# Patient Record
Sex: Male | Born: 1945 | Race: White | Hispanic: No | Marital: Married | Smoking: Former smoker
Health system: Southern US, Community
[De-identification: ages and names within clinical notes are randomized; demographics above are authoritative.]

## PROBLEM LIST (undated history)

## (undated) DIAGNOSIS — I10 Essential (primary) hypertension: Secondary | ICD-10-CM

## (undated) DIAGNOSIS — R51 Headache: Secondary | ICD-10-CM

## (undated) DIAGNOSIS — R519 Headache, unspecified: Secondary | ICD-10-CM

## (undated) DIAGNOSIS — R7303 Prediabetes: Secondary | ICD-10-CM

## (undated) DIAGNOSIS — M1990 Unspecified osteoarthritis, unspecified site: Secondary | ICD-10-CM

## (undated) HISTORY — PX: SHOULDER SURGERY: SHX246

## (undated) HISTORY — PX: OTHER SURGICAL HISTORY: SHX169

## (undated) HISTORY — PX: APPENDECTOMY: SHX54

## (undated) HISTORY — PX: CERVICAL DISC SURGERY: SHX588

## (undated) HISTORY — PX: HAND SURGERY: SHX662

---

## 2002-01-20 ENCOUNTER — Ambulatory Visit (HOSPITAL_COMMUNITY): Admission: RE | Admit: 2002-01-20 | Discharge: 2002-01-20 | Payer: Self-pay | Admitting: Gastroenterology

## 2004-10-28 ENCOUNTER — Ambulatory Visit (HOSPITAL_BASED_OUTPATIENT_CLINIC_OR_DEPARTMENT_OTHER): Admission: RE | Admit: 2004-10-28 | Discharge: 2004-10-28 | Payer: Self-pay | Admitting: Orthopedic Surgery

## 2004-10-28 ENCOUNTER — Ambulatory Visit (HOSPITAL_COMMUNITY): Admission: RE | Admit: 2004-10-28 | Discharge: 2004-10-28 | Payer: Self-pay | Admitting: Orthopedic Surgery

## 2005-02-05 ENCOUNTER — Ambulatory Visit (HOSPITAL_BASED_OUTPATIENT_CLINIC_OR_DEPARTMENT_OTHER): Admission: RE | Admit: 2005-02-05 | Discharge: 2005-02-05 | Payer: Self-pay | Admitting: Orthopedic Surgery

## 2005-06-02 ENCOUNTER — Ambulatory Visit (HOSPITAL_COMMUNITY): Admission: RE | Admit: 2005-06-02 | Discharge: 2005-06-02 | Payer: Self-pay | Admitting: Orthopedic Surgery

## 2005-06-02 ENCOUNTER — Ambulatory Visit (HOSPITAL_BASED_OUTPATIENT_CLINIC_OR_DEPARTMENT_OTHER): Admission: RE | Admit: 2005-06-02 | Discharge: 2005-06-02 | Payer: Self-pay | Admitting: Orthopedic Surgery

## 2010-01-23 ENCOUNTER — Encounter: Admission: RE | Admit: 2010-01-23 | Discharge: 2010-01-23 | Payer: Self-pay | Admitting: Family Medicine

## 2011-04-24 NOTE — Op Note (Signed)
NAME:  Adam Bradshaw, Adam Bradshaw                 ACCOUNT NO.:  [id number]   MEDICAL RECORD NO.:  [id number]          PATIENT TYPE:  AMB   LOCATION:  DSC                          FACILITY:  [facility]   PHYSICIAN:  Katy Fitch. Sypher Montez Hageman., M.D.DATE OF BIRTH:  [birth year]   DATE OF PROCEDURE:  10/28/2004  DATE OF DISCHARGE:                                 OPERATIVE REPORT   PREOPERATIVE DIAGNOSIS:  Right thumb metacarpal phalangeal traumatic  arthritis.   POSTOPERATIVE DIAGNOSIS:  Right thumb metacarpal phalangeal traumatic  arthritis.   PROCEDURE:  Arthrodesis of right thumb metacarpal phalangeal joint.   SURGEON:  Katy Fitch. Sypher, M.D.   ASSISTANT:  Jonni Sanger, P.A.   ANESTHESIA:  General by LMA, supervising anesthesiologist is Bedelia Person, M.D.   INDICATIONS FOR PROCEDURE:  The patient is a 65 year old man who sustained  an injury to his right thumb metacarpal phalangeal joint in March of 2005.  He did not seek medical attention.  He is a patient familiar with [facility] as he underwent extensive reconstructive procedure in  1984.   He developed a progressive deformity with instability of his right thumb  metacarpal phalangeal joint due to a chronic ulnar collateral ligament  injury and progressive swelling and pain.   He sought a hand surgery consult and was noted to have advanced traumatic  arthritis of his right thumb metacarpal phalangeal joint with gross  instability.   As he is a gentleman who works vigorously with his hands, we recommended  proceeding directly to arthrodesis of his right thumb MP joint.  After  informed consent, he is brought to the operating room at this time.   DESCRIPTION OF PROCEDURE:  The patient is brought to the operating room and  placed in the supine position on the operating table.  Following the  induction of general anesthesia by LMA, the right arm was prepped with  Betadine soap and solution and sterilely draped.  1 gram of Ancef  is  administered as an IV prophylactic antibiotic.  Following exsanguination of  the limb with an esmarch bandage, an arterial tourniquet over the proximal  brachium is inflated to 230 mmHg.   The procedure commenced with a curvilinear incision exposing the extensor  mechanism overlying the metacarpal phalangeal joint.  The interval between  the extensor pollicis longus and the extensor pollicis brevis was incised  longitudinally revealing the capsule of the MP joint.  The capsule was  incised longitudinally followed by subperiosteal elevation of the capsule  off of the head of the metacarpal and proximal phalanx exposing the  collateral ligaments.  The radial and ulnar collateral ligament remnants  were resected at the level of the metacarpal neck followed by shotgun  opening of the metacarpal phalangeal joint.   A rongeur was used to tailor the metacarpal head to a congruous bullet shape  and the power reamer was used to reshape the proximal phalangeal base to a  cup to allow congruous placement of the MP joint at 15 degrees of flexion.   A 26 gauge wire was double stranded  and placed through a drill hole through  the base of the proximal phalanx followed by placement of two 0.045 inch  Kirschner wires, maintaining the MP joint at approximately 15 degrees of  flexion.   The Kirschner wires were advanced followed by tensioning of the tension band  wire dorsally.   AP lateral images of the construct revealed very satisfactory position of  the MP joint and internal fixation hardware.   The wounds were then irrigated thoroughly followed by repair of the capsule  with running suture of 4-0 Vicryl followed by repair of the extensor  mechanism with mattress sutures, knots buried, of 4-0 Mersilene and repair  of the skin with intradermal 3-0 Prolene.   A compressive dressing was applied with Xeroform sterile gauze and a thumb  spica splint.   There were no apparent complications.    The patient tolerated the surgery and anesthesia well.  He was transferred  to the recovery room with stable vital signs.   He will be discharged home with prescriptions for Percocet 7.5 mg one p.o.  q.6h p.r.n. pain, also Keflex 500 mg one p.o. q.8h x4 days for prophylactic  antibiotic, and Motrin 600 mg one p.o. q.6h p.r.n. pain 30 tablets with one  refill.      Robe   RVS/MEDQ  D:  10/28/2004  T:  10/28/2004  Job:  [id number]

## 2011-04-24 NOTE — Op Note (Signed)
NAME:  KAIN, MILOSEVIC                 ACCOUNT NO.:  [id number]   MEDICAL RECORD NO.:  [id number]          PATIENT TYPE:  AMB   LOCATION:  DSC                          FACILITY:  [facility]   PHYSICIAN:  Katy Fitch. Sypher Montez Hageman., M.D.DATE OF BIRTH:  11-03-1946   DATE OF PROCEDURE:  02/05/2005  DATE OF DISCHARGE:                                 OPERATIVE REPORT   PREOPERATIVE DIAGNOSIS:  Failed arthrodesis right thumb MP joint performed  in November 2005.   POSTOPERATIVE DIAGNOSIS:  Failed arthrodesis right thumb MP joint performed  in November 2005.   OPERATION:  1.  Removal of hardware, right thumb including tension band wire.  2.  Repeat arthrodesis of right thumb metacarpal phalangeal joint utilizing      120 mm standard Acutrak 2 compression screw and one mini Acutrak 2      compression screw.   OPERATING SURGEON:  Katy Fitch. Sypher, M.D.   ASSISTANT:  Jonni Sanger, P.A.   ANESTHESIA:  General by LMA.   SUPERVISING ANESTHESIOLOGIST:  Dr. Katrinka Blazing.   INDICATION:  Adam Bradshaw is a 65 year old gentleman who was referred for  evaluation and management of painful right thumb MP joint. 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  We recommended proceeding with elective arthrodesis in November 2005.  This  was accomplished under general anesthesia with placement of Kirschner wires  and a tension band.  He initially did well for four weeks postop, but due to  aggressive use of his hand while in his dressing, developed pin tract  infections.  Due to his significant infection unresponsive to doxycycline  orally, we elected to remove his pins at 5 weeks postop.   Mr. Nine was placed in a well molded thumb spica cast. He did not inform us  of his difficulties with claustrophobia.  However, upon returning home, he  became extremely anxious, paced in his yard for two hours and subsequently  cut his own cast off.  He returned two weeks later with an  unstable MP joint  and that was placed in a splint to no avail.  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  Therefore we  recommended an attempt at percutaneous fixation at this time.   Upon examination under anesthesia we identified that this was grossly  unstable and C-arm images suggested that he had significant bony resorption  suggestive of possible fibrous union verses low grade infection, therefore  we proceeded directly to open hardware removal and repeat fusion of the MP  joint.   PROCEDURE:  Biagio Snelson was brought to operating room and placed supine  position on the table.  Following induction of general anesthesia by LMA,  the right arm was prepped with Betadine soap solution, sterilely draped.  A  pneumatic tourniquet was applied proximal brachium.  Following  exsanguination limb Esmarch bandage, arterial tourniquet was inflated to 250  mmHg.  Preoperatively he was given 1 gram of Ancef as IV prophylactic  antibiotic.   Procedure commenced  with examination of the thumb MP joint under anesthesia.  There was grossly false motion with a more than 35 degree arc of flexion  extension and 15 degree arc of radial and ulnar deviation.   There was a 3-4 mm gap across the fusion site, suggesting a unstable fibrous  union.  Given this predicament we abandoned our attempts to do a  percutaneous fixation. The prior surgical incision was reopened, the tendon  split between extensor pollicis brevis and extensor pollicis longus taken  down sharply and the capsule elevated exposing the distal metacarpal and  proximal phalanx.   There was considerable erosion around the previous pin tracts but the  granulation tissue that did not appear to be acutely infected.  All  granulation tissue was removed followed by completion of repeat cup and cone  type arthrodesis. A power bur was used to decorticate the proximal  phalangeal base.  Bone was  removed from the palmar surface of the metacarpal  head and an effort to obtain local bone graft.  The MP joint was then placed  a few degrees of flexion and slight pronation to facilitate thumb pulp  contact with finger pulps during pinch prevention.   Two 0.045 inches Kirschner wires were placed securing the fusion site in a  satisfactory position followed by placement of a 20 mm standard percutaneous  Acutrak 2 and a 20 mm mini Acutrak screw across the fusion site creating  very satisfactory compression.   The wound was then irrigated.  The local bone graft placed in proper  position followed by repair of the capsule with running suture of 3-0  Ethibond and repair of the extensor interval and aponeurosis with figure-of-  eight sutures of 3-0 Ethibond with knots buried.   The wound was lavaged a second time followed by repair of the skin with  intradermal 3-0 Prolene.   A compressive thumb spica dressing was applied. There no apparent  complications. Note, for aftercare, he is given prescriptions for Dilaudid 2  milligrams p.o. q.4-6 h. p.r.n. pain, 30 tablets without refill. Also Keflex  5 milligrams one p.o. q.8 h x4 days as prophylactic antibiotic.      RVS/MEDQ  D:  02/05/2005  T:  02/05/2005  Job:  [id number]

## 2011-04-24 NOTE — Procedures (Signed)
[place]  Patient:    Adam Bradshaw, Adam Bradshaw Visit Number: [id number] MRN: [id number]          Service Type: END Location: ENDO Attending Physician:  Rich Brave Dictated by:   Florencia Reasons, M.D. Proc. Date: 01/20/02 Admit Date:  01/20/2002   CC:         Maricela Bo, M.D.   Procedure Report  PROCEDURE:  Colonoscopy.  INDICATION:  A 65 year old, for colon cancer screening.  FINDINGS:  Normal exam.  DESCRIPTION OF PROCEDURE:  The nature, purpose, and risks of the procedure had been discussed with the patient, who provided written consent.  Sedation was fentanyl 40 mcg and Versed 4 mg IV without arrhythmias or desaturation. Digital rectal exam was unremarkable.  The Olympus adjustable-tension pediatric video colonoscope was easily advanced to the cecum, as identified by the typical cecal appearance, and pullback was then performed.  The quality of the prep was excellent, and it was felt that all areas were well-seen.  This was a normal examination.  No polyps, cancer, colitis, vascular malformations, or significant diverticulosis were noted.  No biopsies were obtained.  The patient tolerated the procedure well, and there were no apparent complications.  IMPRESSION:  Normal screening colonoscopy in an asymptomatic 65 year old.  PLAN:  Consider flexible sigmoidoscopy or repeat colonoscopy in about five years. Dictated by:   Florencia Reasons, M.D. Attending Physician:  Rich Brave DD:  01/20/02 TD:  01/21/02 Job: [id number]

## 2011-04-24 NOTE — Op Note (Signed)
NAME:  Adam Bradshaw, Adam Bradshaw                 ACCOUNT NO.:  [id number]   MEDICAL RECORD NO.:  [id number]          PATIENT TYPE:  AMB   LOCATION:  DSC                          FACILITY:  [facility]   PHYSICIAN:  Katy Fitch. Sypher, M.D. 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   OPERATION:  1.  Diagnostic arthroscopy right glenohumeral joint.  2.  Arthroscopic debridement of anterior labrum and deep surface rotator      cuff degenerative tear of posterior supraspinatus and anterior      infraspinatus.  3.  Subacromial decompression with bursectomy, coracoacromial ligament      release and acromioplasty.  4.  Open resection of distal clavicle.   After extensive evaluation of the cuff. We elected not to proceed with  rotator cuff repair.   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   I have had a longstanding relationship with [name], treating number of  hand injuries over the years.   Recently he developed pain in his right shoulder and requested a  consultation on April 01, 2005. Films at that time documented severe AC  arthropathy and an MRI of the shoulder obtained and [facility] on April 01, 2005, revealed evidence of extensive tendinopathy of the infraspinatus,  supraspinatus tendons and a severely hypertrophic AC joint.   We recommended proceeding  with diagnostic arthroscopy at this time with  appropriate intervention, anticipating possible arthroscopic or open repair  of the rotator cuff.   After informed consent, [name] is brought to the operating room at this  time.   PROCEDURE:  [name]. Freid was brought to the operating room and placed in  supine position upon the operating table. Following anesthesia consultation  by Dr. Gelene Mink, an interscalene block was placed out complication.  Satisfactory anesthesia of the right upper extremity and forequarter was  accomplished.   Following induction of general orotracheal anesthesia, he was carefully  positioned beach-chair position with the aide of a of a torso and head  holder designed for shoulder arthroscopy.   The entire right arm and forequarter were prepped with DuraPrep and draped  with impervious arthroscopy drapes.   Examination of the shoulder under anesthesia revealed no gross instability.   The shoulder was then distended with 20 mL of sterile saline placed with a  spinal needle through an anterior approach. The scope was placed  atraumatically through a posterior portal. Diagnostic arthroscopy revealed  extensive deep surface tearing of the infraspinatus and supraspinatus  tendons. This was documented with the digital camera. An anterior portal  was  created under direct vision and a suction shaver was used to debride the  degenerative cuff as well as extensive degenerative changes of the anterior  labrum.   The anterior glenohumeral ligaments were intact. The subscapularis tendon  was intact. Approximately half the thickness of the supraspinatus and  infraspinatus remained intact. The teres minor was normal. The humeral head  and glenoid fossa were examined and the hyaline sigmoid cartilage was noted  be intact. The biceps anchor was stable and the biceps tendon was normal  through the rotator interval.   After thorough debridement of the deep surface  rotator cuff degenerative  changes, the scope was removed from the glenohumeral joint and placed in the  subacromial space. Extensive bursitis was resected, followed by use of  suction shaver to debride the capsule of the AC joint. The osteophyte at the  distal clavicle was documented with the digital camera followed by release  of coracoacromial ligament with a cutting cautery.   The acromion was leveled to a type I morphology with a beveled anterior  lateral edge and the distal clavicle was initially removed arthroscopically,  however, due to extensive bleeding and difficulty controlling Mr. Aversa  blood pressure we elected to complete the process with a small incision.   A 2 cm incision was fashioned exposing the distal 15 mm of clavicle. Baby  Bennett retractors were placed followed by resection of the clavicle with an  oscillating saw.   The dead space created by distal clavicle resection was repaired with three  mattress sutures of #2 FiberWire. The wound was repaired with subdermal  sutures of 2-0 Vicryl and intradermal 3-0 Prolene with Steri-Strips.   The scope was replaced in the subacromial space followed by thorough  examination of the acromioplasty and bipolar hemostasis.   We carefully palpated the rotator cuff and found that the deep surface tear  was not full-thickness in the supraspinatus, nor infraspinatus and given the  low profile of the tendon, I elected not to proceed with any repair at this  time.   We accomplished satisfactory decompression and distal clavicle resection.   The arthroscopic probe was removed followed by repair of the portals with  intradermal 3-0 Prolene.   There were no apparent complications.       RVS/MEDQ  D:  06/02/2005  T:  06/02/2005  Job:  [id number]

## 2013-04-05 ENCOUNTER — Ambulatory Visit: Payer: Self-pay | Admitting: Family Medicine

## 2013-12-13 ENCOUNTER — Ambulatory Visit: Payer: Self-pay | Admitting: Physician Assistant

## 2016-06-30 ENCOUNTER — Other Ambulatory Visit: Payer: Self-pay | Admitting: Neurosurgery

## 2016-06-30 DIAGNOSIS — M5416 Radiculopathy, lumbar region: Secondary | ICD-10-CM

## 2016-07-09 ENCOUNTER — Ambulatory Visit
Admission: RE | Admit: 2016-07-09 | Discharge: 2016-07-09 | Disposition: A | Discharge status: Home / Self Care | Payer: Medicare Other | Source: Ambulatory Visit | Attending: Neurosurgery | Admitting: Neurosurgery

## 2016-07-09 DIAGNOSIS — M5416 Radiculopathy, lumbar region: Secondary | ICD-10-CM

## 2016-07-13 ENCOUNTER — Other Ambulatory Visit: Payer: Self-pay | Admitting: Neurosurgery

## 2016-07-15 NOTE — Pre-Procedure Instructions (Signed)
Adam GardenerRoger D Bradshaw  07/15/2016     No Pharmacies Listed   Your procedure is scheduled on Tues, Aug 15 @ 1:20 PM  [facility] Admitting at 10:15 AM  Call this number if you have problems the morning of surgery:  [phone]   Remember:  Do not eat food or drink liquids after midnight.     Do not wear jewelry.  Do not wear lotions, powders, or colognes.    Men may shave face and neck.  Do not bring valuables to the hospital.  [facility] is not responsible for any belongings or valuables.  Contacts, dentures or bridgework may not be worn into surgery.  Leave your suitcase in the car.  After surgery it may be brought to your room.  For patients admitted to the hospital, discharge time will be determined by your treatment team.  Patients discharged the day of surgery will not be allowed to drive home.    [facility]  Before surgery, you can play an important role.  Because skin is not sterile, your skin needs to be as free of germs as possible.  You can reduce the number of germs on you skin by washing with CHG (chlorahexidine gluconate) soap before surgery.  CHG is an antiseptic cleaner which kills germs and bonds with the skin to continue killing germs even after washing.  Please DO NOT use if you have an allergy to CHG or antibacterial soaps.  If your skin becomes reddened/irritated stop using the CHG and inform your nurse when you arrive at Short Stay.  Do not shave (including legs and underarms) for at least 48 hours prior to the first CHG shower.  You may shave your face.  Please follow these instructions carefully:   1.  Shower with CHG Soap the night before surgery and the                                morning of Surgery.  2.  If you choose to wash your hair, wash your hair first as usual with your       normal shampoo.  3.  After you shampoo, rinse your hair and body thoroughly to remove the                       Shampoo.  4.  Use CHG as you would any other liquid soap.  You can apply chg directly       to the skin and wash gently with scrungie or a clean washcloth.  5.  Apply the CHG Soap to your body ONLY FROM THE NECK DOWN.        Do not use on open wounds or open sores.  Avoid contact with your eyes,       ears, mouth and genitals (private parts).  Wash genitals (private parts)       with your normal soap.  6.  Wash thoroughly, paying special attention to the area where your surgery        will be performed.  7.  Thoroughly rinse your body with warm water from the neck down.  8.  DO NOT shower/wash with your normal soap after using and rinsing off       the CHG Soap.  9.  Pat yourself dry with a clean towel.  10.  Wear clean pajamas.            11.  Place clean sheets on your bed the night of your first shower and do not        sleep with pets.  Day of Surgery  Do not apply any lotions/deoderants the morning of surgery.  Please wear clean clothes to the hospital/surgery center.      Please read over the following fact sheets that you were given. MRSA Information

## 2016-07-16 ENCOUNTER — Encounter (HOSPITAL_COMMUNITY): Payer: Self-pay

## 2016-07-16 ENCOUNTER — Encounter (HOSPITAL_COMMUNITY)
Admission: RE | Admit: 2016-07-16 | Discharge: 2016-07-16 | Disposition: A | Discharge status: Home / Self Care | Payer: Medicare Other | Source: Ambulatory Visit | Attending: Neurosurgery | Admitting: Neurosurgery

## 2016-07-16 DIAGNOSIS — Z01812 Encounter for preprocedural laboratory examination: Secondary | ICD-10-CM | POA: Diagnosis present

## 2016-07-16 DIAGNOSIS — R531 Weakness: Secondary | ICD-10-CM | POA: Diagnosis not present

## 2016-07-16 DIAGNOSIS — M4806 Spinal stenosis, lumbar region: Secondary | ICD-10-CM | POA: Insufficient documentation

## 2016-07-16 DIAGNOSIS — I10 Essential (primary) hypertension: Secondary | ICD-10-CM | POA: Insufficient documentation

## 2016-07-16 DIAGNOSIS — H9190 Unspecified hearing loss, unspecified ear: Secondary | ICD-10-CM | POA: Insufficient documentation

## 2016-07-16 DIAGNOSIS — Z87891 Personal history of nicotine dependence: Secondary | ICD-10-CM | POA: Diagnosis not present

## 2016-07-16 HISTORY — DX: Headache: R51

## 2016-07-16 HISTORY — DX: Essential (primary) hypertension: I10

## 2016-07-16 HISTORY — DX: Unspecified osteoarthritis, unspecified site: M19.90

## 2016-07-16 HISTORY — DX: Headache, unspecified: R51.9

## 2016-07-16 LAB — BASIC METABOLIC PANEL
Anion gap: 11 (ref 5–15)
BUN: 10 mg/dL (ref 6–20)
CO2: 23 mmol/L (ref 22–32)
Calcium: 9.5 mg/dL (ref 8.9–10.3)
Chloride: 103 mmol/L (ref 101–111)
Creatinine, Ser: 0.82 mg/dL (ref 0.61–1.24)
GFR calc Af Amer: >60 mL/min (ref >60)
GFR calc non Af Amer: >60 mL/min (ref >60)
Glucose, Bld: 116 mg/dL — ABNORMAL HIGH (ref 65–99)
Potassium: 4.2 mmol/L (ref 3.5–5.1)
Sodium: 137 mmol/L (ref 135–145)

## 2016-07-16 LAB — SURGICAL PCR SCREEN
MRSA, PCR: NEGATIVE
Staphylococcus aureus: POSITIVE — AB

## 2016-07-16 LAB — CBC
HCT: 44.5 % (ref 39.0–52.0)
Hemoglobin: 15.2 g/dL (ref 13.0–17.0)
MCH: 31.2 pg (ref 26.0–34.0)
MCHC: 34.2 g/dL (ref 30.0–36.0)
MCV: 91.4 fL (ref 78.0–100.0)
Platelets: 197 10*3/uL (ref 150–400)
RBC: 4.87 MIL/uL (ref 4.22–5.81)
RDW: 11.8 % (ref 11.5–15.5)
WBC: 7.2 10*3/uL (ref 4.0–10.5)

## 2016-07-16 NOTE — Progress Notes (Addendum)
PCP: Dr. Gertha CalkinMaura Hamerick @ [facility] or [facility]. (state practice has changed names)  Pt. States had stress test[facility], [facility] El[facility]uran [facility] ordered it. States normal. Test was performed when he started having back problems and they were ruling out heart problems.  Will Request.  Called in mupirocin prescription @ [facility] in [place], [place]

## 2016-07-17 NOTE — Progress Notes (Signed)
[facility]-requ[facility] from [facility]>Pat to send

## 2016-07-20 MED ORDER — CEFAZOLIN SODIUM-DEXTROSE 2-4 GM/100ML-% IV SOLN
2.0000 g | INTRAVENOUS | Status: AC
  Administered 2016-07-21: 2 g via INTRAVENOUS
  Filled 2016-07-20: qty 100

## 2016-07-20 NOTE — H&P (Signed)
Adam GardenerRoger D Bradshaw is an 70 y.o. male.   Chief Complaint: pain to both legs HPI: patient seen by me in the office along his wife with complains of lumbar pain with radiation to both legs up to the point that he is afraid to move around. Had a lumbar mri and was send to us.  Past Medical History:  Diagnosis Date  . Arthritis   . Headache   . Hypertension     Past Surgical History:  Procedure Laterality Date  . APPENDECTOMY    . basal skin surgery x2    . CERVICAL DISC SURGERY    . HAND SURGERY Right   . SHOULDER SURGERY Right     No family history on file. Social History:  reports that he quit smoking about 41 years ago. His smoking use included Cigarettes. He smoked 0.25 packs per day. He has quit using smokeless tobacco. He reports that he does not drink alcohol or use drugs.  Allergies:  Allergies  Allergen Reactions  . No Known Allergies     No prescriptions prior to admission.    No results found for this or any previous visit (from the past 48 hour(s)). No results found.  Review of Systems  Constitutional: Negative.   HENT: Positive for hearing loss.   Eyes: Negative.   Respiratory: Negative.   Cardiovascular: Negative.   Gastrointestinal: Negative.   Genitourinary: Negative.   Musculoskeletal: Positive for back pain.  Neurological: Positive for focal weakness.  Endo/Heme/Allergies: Negative.   Psychiatric/Behavioral: Negative.     There were no vitals taken for this visit. Physical Exam  Hent, nl. Neck, nl, cv, nl. Lungs, clear. Abdomen, no. Extremities, nl . NEURO weakness of bot DF  Sensory, nl. SLE positive at 60 degrees. Femoral stretch maneuver positive bilaterally. Dtr, nl. 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 He is aware of risks such as infection, csf leak, bleeding, no improvement, need for further surgery  Adam Bradshaw,Adam Bradshaw M, MD 07/20/2016, 5:47 PM

## 2016-07-21 ENCOUNTER — Encounter (HOSPITAL_COMMUNITY): Payer: Self-pay | Admitting: *Deleted

## 2016-07-21 ENCOUNTER — Inpatient Hospital Stay (HOSPITAL_COMMUNITY)
Admission: RE | Admit: 2016-07-21 | Discharge: 2016-07-22 | DRG: 517 | Disposition: A | Discharge status: Home / Self Care | Payer: Medicare Other | Source: Ambulatory Visit | Attending: Neurosurgery | Admitting: Neurosurgery

## 2016-07-21 ENCOUNTER — Encounter (HOSPITAL_COMMUNITY)
Admission: RE | Disposition: A | Discharge status: Home / Self Care | Payer: Self-pay | Source: Ambulatory Visit | Attending: Neurosurgery

## 2016-07-21 ENCOUNTER — Ambulatory Visit (HOSPITAL_COMMUNITY): Payer: Medicare Other | Admitting: Critical Care Medicine

## 2016-07-21 ENCOUNTER — Ambulatory Visit (HOSPITAL_COMMUNITY): Payer: Medicare Other

## 2016-07-21 DIAGNOSIS — M1990 Unspecified osteoarthritis, unspecified site: Secondary | ICD-10-CM | POA: Diagnosis present

## 2016-07-21 DIAGNOSIS — I10 Essential (primary) hypertension: Secondary | ICD-10-CM | POA: Diagnosis not present

## 2016-07-21 DIAGNOSIS — Z87891 Personal history of nicotine dependence: Secondary | ICD-10-CM

## 2016-07-21 DIAGNOSIS — M545 Low back pain: Secondary | ICD-10-CM | POA: Diagnosis present

## 2016-07-21 DIAGNOSIS — M4806 Spinal stenosis, lumbar region: Secondary | ICD-10-CM | POA: Diagnosis not present

## 2016-07-21 DIAGNOSIS — M48062 Spinal stenosis, lumbar region with neurogenic claudication: Secondary | ICD-10-CM | POA: Diagnosis present

## 2016-07-21 DIAGNOSIS — Z419 Encounter for procedure for purposes other than remedying health state, unspecified: Secondary | ICD-10-CM

## 2016-07-21 HISTORY — PX: LUMBAR LAMINECTOMY/DECOMPRESSION MICRODISCECTOMY: SHX5026

## 2016-07-21 SURGERY — LUMBAR LAMINECTOMY/DECOMPRESSION MICRODISCECTOMY 1 LEVEL
Anesthesia: General | Site: Back

## 2016-07-21 MED ORDER — EPHEDRINE SULFATE 50 MG/ML IJ SOLN
INTRAMUSCULAR | Status: DC | PRN
  Administered 2016-07-21 (×2): 5 mg via INTRAVENOUS

## 2016-07-21 MED ORDER — ONDANSETRON HCL 4 MG/2ML IJ SOLN
INTRAMUSCULAR | Status: DC | PRN
  Administered 2016-07-21: 4 mg via INTRAVENOUS

## 2016-07-21 MED ORDER — PROPOFOL 10 MG/ML IV BOLUS
INTRAVENOUS | Status: AC
  Filled 2016-07-21: qty 20

## 2016-07-21 MED ORDER — HYDROMORPHONE HCL 1 MG/ML IJ SOLN
INTRAMUSCULAR | Status: AC
  Administered 2016-07-21: 0.5 mg via INTRAVENOUS
  Filled 2016-07-21: qty 1

## 2016-07-21 MED ORDER — CHLORHEXIDINE GLUCONATE CLOTH 2 % EX PADS
6.0000 | MEDICATED_PAD | Freq: Once | CUTANEOUS | Status: DC
Start: 2016-07-21 — End: 2016-07-21

## 2016-07-21 MED ORDER — FENTANYL CITRATE (PF) 250 MCG/5ML IJ SOLN
INTRAMUSCULAR | Status: AC
  Filled 2016-07-21: qty 5

## 2016-07-21 MED ORDER — HEMOSTATIC AGENTS (NO CHARGE) OPTIME
TOPICAL | Status: DC | PRN
  Administered 2016-07-21: 1 via TOPICAL

## 2016-07-21 MED ORDER — ONDANSETRON HCL 4 MG/2ML IJ SOLN
INTRAMUSCULAR | Status: AC
  Filled 2016-07-21: qty 2

## 2016-07-21 MED ORDER — PHENOL 1.4 % MT LIQD: 1.0000 | OROMUCOSAL | Status: DC | PRN

## 2016-07-21 MED ORDER — METHYLPREDNISOLONE ACETATE 80 MG/ML IJ SUSP
INTRAMUSCULAR | Status: DC | PRN
  Administered 2016-07-21: 80 mg

## 2016-07-21 MED ORDER — TAMSULOSIN HCL 0.4 MG PO CAPS
0.4000 mg | ORAL_CAPSULE | Freq: Every day | ORAL | Status: DC
  Administered 2016-07-22: 0.4 mg via ORAL
  Filled 2016-07-21: qty 1

## 2016-07-21 MED ORDER — MIDAZOLAM HCL 2 MG/2ML IJ SOLN: 0.5000 mg | Freq: Once | INTRAMUSCULAR | Status: DC | PRN

## 2016-07-21 MED ORDER — PROPOFOL 10 MG/ML IV BOLUS
INTRAVENOUS | Status: AC
Start: 2016-07-21 — End: 2016-07-21
  Filled 2016-07-21: qty 20

## 2016-07-21 MED ORDER — MORPHINE SULFATE (PF) 2 MG/ML IV SOLN: 1.0000 mg | INTRAVENOUS | Status: DC | PRN

## 2016-07-21 MED ORDER — PHENYLEPHRINE HCL 10 MG/ML IJ SOLN
INTRAMUSCULAR | Status: DC | PRN
  Administered 2016-07-21: 40 ug via INTRAVENOUS
  Administered 2016-07-21 (×2): 80 ug via INTRAVENOUS

## 2016-07-21 MED ORDER — FENTANYL CITRATE (PF) 100 MCG/2ML IJ SOLN
INTRAMUSCULAR | Status: DC | PRN
  Administered 2016-07-21: 100 ug via INTRAVENOUS

## 2016-07-21 MED ORDER — LIDOCAINE HCL (CARDIAC) 20 MG/ML IV SOLN
INTRAVENOUS | Status: DC | PRN
  Administered 2016-07-21: 20 mg via INTRAVENOUS

## 2016-07-21 MED ORDER — MENTHOL 3 MG MT LOZG: 1.0000 | LOZENGE | OROMUCOSAL | Status: DC | PRN

## 2016-07-21 MED ORDER — SODIUM CHLORIDE 0.9 % IV SOLN: 250.0000 mL | INTRAVENOUS | Status: DC

## 2016-07-21 MED ORDER — SUGAMMADEX SODIUM 200 MG/2ML IV SOLN
INTRAVENOUS | Status: DC | PRN
  Administered 2016-07-21: 200 mg via INTRAVENOUS

## 2016-07-21 MED ORDER — LOSARTAN POTASSIUM 50 MG PO TABS
100.0000 mg | ORAL_TABLET | Freq: Every day | ORAL | Status: DC
  Administered 2016-07-22: 100 mg via ORAL
  Filled 2016-07-21: qty 2

## 2016-07-21 MED ORDER — 0.9 % SODIUM CHLORIDE (POUR BTL) OPTIME
TOPICAL | Status: DC | PRN
  Administered 2016-07-21: 1000 mL

## 2016-07-21 MED ORDER — OXYCODONE-ACETAMINOPHEN 5-325 MG PO TABS
1.0000 | ORAL_TABLET | ORAL | Status: DC | PRN
  Administered 2016-07-21 – 2016-07-22 (×2): 1 via ORAL
  Filled 2016-07-21 (×2): qty 1

## 2016-07-21 MED ORDER — FENTANYL CITRATE (PF) 100 MCG/2ML IJ SOLN
INTRAMUSCULAR | Status: AC
  Filled 2016-07-21: qty 2

## 2016-07-21 MED ORDER — CHLORHEXIDINE GLUCONATE CLOTH 2 % EX PADS: 6.0000 | MEDICATED_PAD | Freq: Once | CUTANEOUS | Status: DC

## 2016-07-21 MED ORDER — SODIUM CHLORIDE 0.9% FLUSH
3.0000 mL | Freq: Two times a day (BID) | INTRAVENOUS | Status: DC
  Administered 2016-07-22: 3 mL via INTRAVENOUS

## 2016-07-21 MED ORDER — SENNA 8.6 MG PO TABS
1.0000 | ORAL_TABLET | Freq: Two times a day (BID) | ORAL | Status: DC
  Administered 2016-07-21 – 2016-07-22 (×2): 8.6 mg via ORAL
  Filled 2016-07-21 (×2): qty 1

## 2016-07-21 MED ORDER — CYCLOBENZAPRINE HCL 10 MG PO TABS: 10.0000 mg | ORAL_TABLET | Freq: Three times a day (TID) | ORAL | Status: DC | PRN

## 2016-07-21 MED ORDER — PROPOFOL 10 MG/ML IV BOLUS
INTRAVENOUS | Status: DC | PRN
  Administered 2016-07-21: 150 mg via INTRAVENOUS

## 2016-07-21 MED ORDER — HYDROMORPHONE HCL 1 MG/ML IJ SOLN
0.2500 mg | INTRAMUSCULAR | Status: DC | PRN
  Administered 2016-07-21 (×2): 0.5 mg via INTRAVENOUS

## 2016-07-21 MED ORDER — DEXAMETHASONE SODIUM PHOSPHATE 10 MG/ML IJ SOLN
INTRAMUSCULAR | Status: DC | PRN
  Administered 2016-07-21: 10 mg via INTRAVENOUS

## 2016-07-21 MED ORDER — ONDANSETRON HCL 4 MG/2ML IJ SOLN: 4.0000 mg | INTRAMUSCULAR | Status: DC | PRN

## 2016-07-21 MED ORDER — ROSUVASTATIN CALCIUM 10 MG PO TABS
10.0000 mg | ORAL_TABLET | Freq: Every day | ORAL | Status: DC
  Administered 2016-07-22: 10 mg via ORAL
  Filled 2016-07-21 (×2): qty 1

## 2016-07-21 MED ORDER — SODIUM CHLORIDE 0.9 % IV SOLN
INTRAVENOUS | Status: DC
  Administered 2016-07-21: 17:00:00 via INTRAVENOUS

## 2016-07-21 MED ORDER — FENTANYL CITRATE (PF) 100 MCG/2ML IJ SOLN
INTRAMUSCULAR | Status: DC | PRN
  Administered 2016-07-21: 150 ug via INTRAVENOUS
  Administered 2016-07-21 (×2): 50 ug via INTRAVENOUS

## 2016-07-21 MED ORDER — PROMETHAZINE HCL 25 MG/ML IJ SOLN: 6.2500 mg | INTRAMUSCULAR | Status: DC | PRN

## 2016-07-21 MED ORDER — MEPERIDINE HCL 25 MG/ML IJ SOLN: 6.2500 mg | INTRAMUSCULAR | Status: DC | PRN

## 2016-07-21 MED ORDER — SODIUM CHLORIDE 0.9% FLUSH: 3.0000 mL | INTRAVENOUS | Status: DC | PRN

## 2016-07-21 MED ORDER — CEFAZOLIN IN D5W 1 GM/50ML IV SOLN
1.0000 g | Freq: Three times a day (TID) | INTRAVENOUS | Status: AC
  Administered 2016-07-21 – 2016-07-22 (×2): 1 g via INTRAVENOUS
  Filled 2016-07-21 (×2): qty 50

## 2016-07-21 MED ORDER — LACTATED RINGERS IV SOLN
INTRAVENOUS | Status: DC
  Administered 2016-07-21 (×2): via INTRAVENOUS

## 2016-07-21 MED ORDER — THROMBIN 5000 UNITS EX SOLR
CUTANEOUS | Status: DC | PRN
  Administered 2016-07-21 (×2): 5000 [IU] via TOPICAL

## 2016-07-21 MED ORDER — ACETAMINOPHEN 325 MG PO TABS: 650.0000 mg | ORAL_TABLET | ORAL | Status: DC | PRN

## 2016-07-21 MED ORDER — ROCURONIUM BROMIDE 100 MG/10ML IV SOLN
INTRAVENOUS | Status: DC | PRN
  Administered 2016-07-21: 10 mg via INTRAVENOUS
  Administered 2016-07-21: 50 mg via INTRAVENOUS
  Administered 2016-07-21: 10 mg via INTRAVENOUS

## 2016-07-21 MED ORDER — MIDAZOLAM HCL 5 MG/5ML IJ SOLN
INTRAMUSCULAR | Status: DC | PRN
  Administered 2016-07-21: 2 mg via INTRAVENOUS

## 2016-07-21 MED ORDER — ACETAMINOPHEN 650 MG RE SUPP: 650.0000 mg | RECTAL | Status: DC | PRN

## 2016-07-21 MED ORDER — MIDAZOLAM HCL 2 MG/2ML IJ SOLN
INTRAMUSCULAR | Status: AC
  Filled 2016-07-21: qty 2

## 2016-07-21 SURGICAL SUPPLY — 62 items
APL SKNCLS STERI-STRIP NONHPOA (GAUZE/BANDAGES/DRESSINGS) ×1
BENZOIN TINCTURE PRP APPL 2/3 (GAUZE/BANDAGES/DRESSINGS) ×3 IMPLANT
BLADE CLIPPER SURG (BLADE) ×2 IMPLANT
BUR ACORN 6.0 (BURR) ×2 IMPLANT
BUR ACORN 6.0MM (BURR) ×1
BUR MATCHSTICK NEURO 3.0 LAGG (BURR) ×2 IMPLANT
CANISTER SUCT 3000ML PPV (MISCELLANEOUS) ×3 IMPLANT
CLOSURE WOUND 1/2 X4 (GAUZE/BANDAGES/DRESSINGS) ×1
DRAPE LAPAROTOMY 100X72X124 (DRAPES) ×3 IMPLANT
DRAPE MICROSCOPE LEICA (MISCELLANEOUS) ×3 IMPLANT
DRAPE POUCH INSTRU U-SHP 10X18 (DRAPES) ×3 IMPLANT
DRSG OPSITE 4X5.5 SM (GAUZE/BANDAGES/DRESSINGS) ×2 IMPLANT
DRSG OPSITE POSTOP 4X6 (GAUZE/BANDAGES/DRESSINGS) ×2 IMPLANT
DRSG PAD ABDOMINAL 8X10 ST (GAUZE/BANDAGES/DRESSINGS) IMPLANT
DURAPREP 26ML APPLICATOR (WOUND CARE) ×3 IMPLANT
ELECT REM PT RETURN 9FT ADLT (ELECTROSURGICAL) ×3
ELECTRODE REM PT RTRN 9FT ADLT (ELECTROSURGICAL) ×1 IMPLANT
EVACUATOR 1/8 PVC DRAIN (DRAIN) ×2 IMPLANT
GAUZE SPONGE 4X4 12PLY STRL (GAUZE/BANDAGES/DRESSINGS) ×3 IMPLANT
GAUZE SPONGE 4X4 16PLY XRAY LF (GAUZE/BANDAGES/DRESSINGS) IMPLANT
GLOVE BIOGEL M 8.0 STRL (GLOVE) ×3 IMPLANT
GLOVE BIOGEL PI IND STRL 7.0 (GLOVE) IMPLANT
GLOVE BIOGEL PI IND STRL 8.5 (GLOVE) IMPLANT
GLOVE BIOGEL PI INDICATOR 7.0 (GLOVE) ×2
GLOVE BIOGEL PI INDICATOR 8.5 (GLOVE) ×2
GLOVE ECLIPSE 8.5 STRL (GLOVE) ×2 IMPLANT
GLOVE EXAM NITRILE LRG STRL (GLOVE) IMPLANT
GLOVE EXAM NITRILE MD LF STRL (GLOVE) IMPLANT
GLOVE EXAM NITRILE XL STR (GLOVE) IMPLANT
GLOVE EXAM NITRILE XS STR PU (GLOVE) IMPLANT
GLOVE SURG SS PI 6.5 STRL IVOR (GLOVE) ×6 IMPLANT
GOWN STRL REUS W/ TWL LRG LVL3 (GOWN DISPOSABLE) ×1 IMPLANT
GOWN STRL REUS W/ TWL XL LVL3 (GOWN DISPOSABLE) IMPLANT
GOWN STRL REUS W/TWL 2XL LVL3 (GOWN DISPOSABLE) ×2 IMPLANT
GOWN STRL REUS W/TWL LRG LVL3 (GOWN DISPOSABLE) ×6
GOWN STRL REUS W/TWL XL LVL3 (GOWN DISPOSABLE)
KIT BASIN OR (CUSTOM PROCEDURE TRAY) ×3 IMPLANT
KIT ROOM TURNOVER OR (KITS) ×3 IMPLANT
NDL HYPO 18GX1.5 BLUNT FILL (NEEDLE) IMPLANT
NDL HYPO 21X1.5 SAFETY (NEEDLE) IMPLANT
NDL HYPO 25X1 1.5 SAFETY (NEEDLE) IMPLANT
NDL SPNL 20GX3.5 QUINCKE YW (NEEDLE) IMPLANT
NEEDLE HYPO 18GX1.5 BLUNT FILL (NEEDLE) ×3 IMPLANT
NEEDLE HYPO 21X1.5 SAFETY (NEEDLE) IMPLANT
NEEDLE HYPO 25X1 1.5 SAFETY (NEEDLE) IMPLANT
NEEDLE SPNL 20GX3.5 QUINCKE YW (NEEDLE) ×3 IMPLANT
NS IRRIG 1000ML POUR BTL (IV SOLUTION) ×3 IMPLANT
PACK LAMINECTOMY NEURO (CUSTOM PROCEDURE TRAY) ×3 IMPLANT
PAD ARMBOARD 7.5X6 YLW CONV (MISCELLANEOUS) ×9 IMPLANT
PATTIES SURGICAL .5 X1 (DISPOSABLE) ×1 IMPLANT
RUBBERBAND STERILE (MISCELLANEOUS) ×6 IMPLANT
SPONGE LAP 4X18 X RAY DECT (DISPOSABLE) IMPLANT
SPONGE SURGIFOAM ABS GEL SZ50 (HEMOSTASIS) ×3 IMPLANT
STRIP CLOSURE SKIN 1/2X4 (GAUZE/BANDAGES/DRESSINGS) ×2 IMPLANT
SUT VIC AB 0 CT1 18XCR BRD8 (SUTURE) ×1 IMPLANT
SUT VIC AB 0 CT1 8-18 (SUTURE) ×3
SUT VIC AB 2-0 CP2 18 (SUTURE) ×3 IMPLANT
SUT VIC AB 3-0 SH 8-18 (SUTURE) ×3 IMPLANT
SYR 5ML LL (SYRINGE) ×2 IMPLANT
TOWEL OR 17X24 6PK STRL BLUE (TOWEL DISPOSABLE) ×3 IMPLANT
TOWEL OR 17X26 10 PK STRL BLUE (TOWEL DISPOSABLE) ×3 IMPLANT
WATER STERILE IRR 1000ML POUR (IV SOLUTION) ×3 IMPLANT

## 2016-07-21 NOTE — Anesthesia Postprocedure Evaluation (Signed)
Anesthesia Post Note  Patient: Adam GardenerRoger D Curiale  Procedure(s) Performed: Procedure(s) (LRB): Lumbar four-five Laminectomy (N/A)  Patient location during evaluation: PACU Anesthesia Type: General Level of consciousness: awake and alert, oriented and patient cooperative Pain management: pain level controlled Vital Signs Assessment: post-procedure vital signs reviewed and stable Respiratory status: spontaneous breathing, nonlabored ventilation, respiratory function stable and patient connected to nasal cannula oxygen Cardiovascular status: blood pressure returned to baseline and stable Postop Assessment: no signs of nausea or vomiting Anesthetic complications: no    Last Vitals:  Vitals:   07/21/16 1550 07/21/16 1605  BP: 128/82 129/83  Pulse: 64 70  Resp: (!) 7 (!) 8  Temp:      Last Pain:  Vitals:   07/21/16 1522  TempSrc:   PainSc: Asleep                 Jalynne Persico,E. Numa Heatwole

## 2016-07-21 NOTE — Op Note (Signed)
NAME:  Adam Bradshaw, Damare                 ACCOUNT NO.:  [id number]  MEDICAL RECORD NO.:  [id number]  LOCATION:  [facility]                         FACILITY:  [facility]  PHYSICIAN:  Hilda LiasErnesto Keaston Pile, M.D.   DATE OF BIRTH:  1946-03-02  DATE OF PROCEDURE:  07/21/2016 DATE OF DISCHARGE:                              OPERATIVE REPORT   PREOPERATIVE DIAGNOSES:  Lumbar stenosis with radiculopathy.  Neurogenic claudication.  POSTOPERATIVE DIAGNOSES:  Lumbar stenosis with radiculopathy. Neurogenic claudication.  PROCEDURES:  Bilateral L4-5 laminectomy, laminotomy.  Foraminotomy to decompress the L4, L5, S1 nerve roots.  Microscope.  SURGEON:  Hilda LiasErnesto Loni Abdon, M.D.  ASSISTANT:  Stefani DamaHenry J. Elsner, M.D.  CLINICAL HISTORY:  [name] is a 70 year old male, complaining of back pain radiation to both legs associated with walking.  X-ray shows stenosis at the level of L4-5.  He has failed with conservative treatment.  At the end, we agreed with surgery, he knew the risks and benefits.  DESCRIPTION OF PROCEDURE:  The patient was taken to the OR, and after intubation, he was positioned in a prone manner.  The back was cleaned with DuraPrep and drapes were applied.  X-ray showed that indeed we were right at the level of L4-5.  From then on, a midline incision through the skin, subcutaneous tissue was made and muscle retracted laterally. We identified the spinous process of L4-5 and with the Leksell, we removed those two processes.  Then, using the drill as well as the 2 and 3-mm Kerrison punch, bilateral laminectomy was achieved with removal of a thick calcified yellow ligament.  With the help of the microscope, we went laterally and we decompressed the foramina to free the L4 and L5 nerve root.  At the end, we had a good decompression.  Valsalva maneuver was negative.  Then, Depo-Medrol and fentanyl were left in the epidural space.  Small drain was left.  Then, the incision was closed with several layers of Vicryl  and Steri-Strip.          ______________________________ Hilda LiasErnesto Aunisty Reali, M.D.     EB/MEDQ  D:  07/21/2016  T:  07/21/2016  Job:  [id number]

## 2016-07-21 NOTE — Anesthesia Preprocedure Evaluation (Addendum)
Anesthesia Evaluation  Patient identified by MRN, date of birth, ID band Patient awake    Reviewed: Allergy & Precautions, NPO status , Patient's Chart, lab work & pertinent test results  History of Anesthesia Complications Negative for: history of anesthetic complications  Airway Mallampati: II  TM Distance: >3 FB Neck ROM: Full    Dental  (+) Poor Dentition, Chipped, Missing, Dental Advisory Given   Pulmonary COPD, former smoker (quit 1976),    breath sounds clear to auscultation       Cardiovascular hypertension, Pt. on medications (-) angina Rhythm:Regular Rate:Normal     Neuro/Psych Chronic back pain: occasional narcotics    GI/Hepatic negative GI ROS, Neg liver ROS,   Endo/Other  Morbid obesity  Renal/GU negative Renal ROS     Musculoskeletal  (+) Arthritis , Osteoarthritis,    Abdominal (+) + obese,   Peds  Hematology   Anesthesia Other Findings   Reproductive/Obstetrics                            Anesthesia Physical Anesthesia Plan  ASA: II  Anesthesia Plan: General   Post-op Pain Management:    Induction: Intravenous  Airway Management Planned: Oral ETT  Additional Equipment:   Intra-op Plan:   Post-operative Plan: Extubation in OR  Informed Consent: I have reviewed the patients History and Physical, chart, labs and discussed the procedure including the risks, benefits and alternatives for the proposed anesthesia with the patient or authorized representative who has indicated his/her understanding and acceptance.   Dental advisory given  Plan Discussed with: CRNA and Surgeon  Anesthesia Plan Comments: (Plan routine monitors, GETA)        Anesthesia Quick Evaluation

## 2016-07-21 NOTE — Progress Notes (Signed)
Patient is admitted to 465C16. Admission vital sign is stable and no family is at the bedside. Patient is AOX4

## 2016-07-21 NOTE — Anesthesia Procedure Notes (Signed)
Procedure Name: Intubation Date/Time: 07/21/2016 1:09 PM Performed by: Glo HerringLEE, Raciel Caffrey B Pre-anesthesia Checklist: Patient identified, Emergency Drugs available, Suction available, Patient being monitored and Timeout performed Patient Re-evaluated:Patient Re-evaluated prior to inductionOxygen Delivery Method: Circle system utilized Preoxygenation: Pre-oxygenation with 100% oxygen Intubation Type: IV induction Ventilation: Oral airway inserted - appropriate to patient size Laryngoscope Size: 4 and Glidescope Grade View: Grade I Tube type: Oral Tube size: 7.5 mm Number of attempts: 1 Airway Equipment and Method: Oral airway and Video-laryngoscopy Placement Confirmation: ETT inserted through vocal cords under direct vision,  positive ETCO2,  CO2 detector and breath sounds checked- equal and bilateral Secured at: 22 cm Tube secured with: Tape Dental Injury: Teeth and Oropharynx as per pre-operative assessment  Comments: Inserted by srna

## 2016-07-21 NOTE — Transfer of Care (Signed)
Immediate Anesthesia Transfer of Care Note  Patient: Alex GardenerRoger D Boutelle  Procedure(s) Performed: Procedure(s): Lumbar four-five Laminectomy (N/A)  Patient Location: PACU  Anesthesia Type:General  Level of Consciousness: awake, alert  and oriented  Airway & Oxygen Therapy: Patient Spontanous Breathing and Patient connected to nasal cannula oxygen  Post-op Assessment: Report given to RN, Post -op Vital signs reviewed and stable and Patient moving all extremities  Post vital signs: Reviewed and stable  Last Vitals:  Vitals:   07/21/16 1028 07/21/16 1446  BP: (!) 161/88 (P) 130/70  Pulse: 75 (P) 74  Resp: 18 (P) 11  Temp: 37.1 C (P) 36.4 C    Last Pain:  Vitals:   07/21/16 1446  TempSrc:   PainSc: (P) Asleep         Complications: No apparent anesthesia complications

## 2016-07-22 ENCOUNTER — Encounter (HOSPITAL_COMMUNITY): Payer: Self-pay | Admitting: Neurosurgery

## 2016-07-22 DIAGNOSIS — M545 Low back pain: Secondary | ICD-10-CM | POA: Diagnosis not present

## 2016-07-22 DIAGNOSIS — M4806 Spinal stenosis, lumbar region: Secondary | ICD-10-CM | POA: Diagnosis not present

## 2016-07-22 MED ORDER — BISACODYL 10 MG RE SUPP
10.0000 mg | Freq: Once | RECTAL | Status: AC
  Administered 2016-07-22: 10 mg via RECTAL
  Filled 2016-07-22: qty 1

## 2016-07-22 NOTE — Progress Notes (Signed)
Pt ambulated in hallway. Gait stable.Tolerated well.

## 2016-07-22 NOTE — Progress Notes (Signed)
Pt discharged home with family, by car, assessment stable, discharge instructions reviewed, 1 written prescriptions given to pt, all questions answered. IV removed. Pt taken by wheelchair to exit. Time of discharge: 1544

## 2016-07-22 NOTE — Progress Notes (Signed)
Patient ID: Adam GardenerRoger D Alwin, male   DOB: Jan 09, 1946, 70 y.o.   MRN: [id number] DOING WELL. WANTS TO GO

## 2016-07-22 NOTE — Evaluation (Signed)
Occupational Therapy Evaluation and Discharge Patient Details Name: Adam Bradshaw MRN: [id number] DOB: 14-Nov-1946 Today's Date: 07/22/2016    History of Present Illness Adam Bradshaw is a 70 yo male who was admitted s/p L4-L5 decompression. PMH includes HTN and arthritis.   Clinical Impression   Pt reports he was independent with ADL PTA. Currently pt overall mod I for ADL and functional mobility with the exception of min assist for LB ADL and supervision for tub transfers. Provided pt with all back, safety, and ADL education; pt with no further questions or concerns for OT at this time. Pt planning to d/c home with supervision from family as needed. No further acute OT needs identified; signing off at this time. Please re-consult if needs change. Thank you for this referral.    Follow Up Recommendations  No OT follow up;Supervision - Intermittent    Equipment Recommendations  None recommended by OT    Recommendations for Other Services       Precautions / Restrictions Precautions Precautions: Fall;Back Precaution Booklet Issued: Yes (comment) Precaution Comments: Pt able to recall 2/3 back precautions. Reviewed all precautions with pt. Restrictions Weight Bearing Restrictions: No      Mobility Bed Mobility Overal bed mobility: Modified Independent             General bed mobility comments: HOB slightly elevated with use of bed rail. Able to perform log roll without cueing.  Transfers Overall transfer level: Modified independent Equipment used: None             General transfer comment: No unsteadiness or LOB noted with static/dynamic standing.    Balance Overall balance assessment: No apparent balance deficits (not formally assessed)                                          ADL Overall ADL's : Needs assistance/impaired Eating/Feeding: Modified independent;Sitting   Grooming: Modified independent;Standing Grooming Details (indicate cue  type and reason): Educated pt on use of 2 cups for oral care. Upper Body Bathing: Modified independent;Standing   Lower Body Bathing: Modified independent;Sit to/from stand   Upper Body Dressing : Modified independent;Sitting   Lower Body Dressing: Minimal assistance;Sit to/from stand Lower Body Dressing Details (indicate cue type and reason): Pt reports family can assist as needed. Educated on compensatory strategies for LB ADL. Toilet Transfer: Modified Independent;Ambulation;Regular Teacher, adult educationToilet Toilet Transfer Details (indicate cue type and reason): No use of grab bars to simulate home environment Toileting- Clothing Manipulation and Hygiene: Modified independent;Sit to/from stand Toileting - Clothing Manipulation Details (indicate cue type and reason): pt able to demo peri care without twisting Tub/ Shower Transfer: Supervision/safety;Tub transfer;Ambulation Tub/Shower Transfer Details (indicate cue type and reason): Simulated tub transfer in room. Functional mobility during ADLs: Modified independent General ADL Comments: Educated pt on maintaining back precautions during functional activities, keeping frequently used items at counter top height, log roll technique for bed mobility.     Vision Vision Assessment?: No apparent visual deficits   Perception     Praxis      Pertinent Vitals/Pain Pain Assessment: 0-10 Pain Score: 1  Pain Location: back Pain Descriptors / Indicators: Sore Pain Intervention(s): Monitored during session     Hand Dominance Right   Extremity/Trunk Assessment Upper Extremity Assessment Upper Extremity Assessment: Overall WFL for tasks assessed   Lower Extremity Assessment Lower Extremity Assessment: Overall WFL for tasks assessed  Cervical / Trunk Assessment Cervical / Trunk Assessment: Other exceptions Cervical / Trunk Exceptions: s/p lumbar sx   Communication Communication Communication: HOH   Cognition Arousal/Alertness:  Awake/alert Behavior During Therapy: WFL for tasks assessed/performed Overall Cognitive Status: Within Functional Limits for tasks assessed                     General Comments       Exercises       Shoulder Instructions      Home Living Family/patient expects to be discharged to:: Private residence Living Arrangements: Spouse/significant other;Children Available Help at Discharge: Family Type of Home: Mobile home Home Access: Stairs to enter Secretary/administratorntrance Stairs-Number of Steps: 4 Entrance Stairs-Rails: Left Home Layout: One level     Bathroom Shower/Tub: Tub/shower unit Shower/tub characteristics: Engineer, building servicesCurtain Bathroom Toilet: Standard     Home Equipment: Medical laboratory scientific officerCane - single point   Additional Comments: Pt lives at home with his wife and 2 adult children      Prior Functioning/Environment Level of Independence: Independent             OT Diagnosis: Acute pain   OT Problem List:     OT Treatment/Interventions:      OT Goals(Current goals can be found in the care plan section) Acute Rehab OT Goals Patient Stated Goal: To return back home OT Goal Formulation: All assessment and education complete, DC therapy  OT Frequency:     Barriers to D/C:            Co-evaluation              End of Session    Activity Tolerance: Patient tolerated treatment well Patient left: in bed;with call bell/phone within reach;with bed alarm set;with SCD's reapplied   Time: [id number] OT Time Calculation (min): 10 min Charges:  OT General Charges $OT Visit: 1 Procedure OT Evaluation $OT Eval Moderate Complexity: 1 Procedure G-Codes:     Gaye AlkenBailey A Kethan Papadopoulos M.S., OTR/L Pager: [phone]  07/22/2016, 10:01 AM

## 2016-07-22 NOTE — Discharge Summary (Signed)
Physician Discharge Summary  Patient ID: Adam GardenerRoger D Bradshaw MRN: [id number] DOB/AGE: [birth year] 70 y.o.  Admit date: 07/21/2016 Discharge date: 07/22/2016  Admission Diagnoses:lumbar stenosis Discharge Diagnoses:  Lumbar stenosis Active Problems:   Lumbar stenosis with neurogenic claudication   Discharged Condition: no pain  Hospital Course: lumbar decompression  Consults: none  Significant Diagnostic Studies: mri  Treatments: lumbar four-five  laminectomies  Discharge Exam: Blood pressure 135/61, pulse 81, temperature 98.1 F (36.7 C), temperature source Oral, resp. rate 20, height 5\' 9"  (1.753 m), weight 96.1 kg (211 lb 14.4 oz), SpO2 99 %. No weakness. Ambulating. Wound dry  Disposition: Final discharge disposition not confirmed     Signed: Cloa Bushong M 07/22/2016, 11:34 AM

## 2016-07-22 NOTE — Evaluation (Signed)
Physical Therapy Evaluation & Discharge Patient Details Name: Adam GardenerRoger D Dubie MRN: [id number] DOB: [birth year] Today's Date: 07/22/2016   History of Present Illness  Adam GardenerRoger D Lipford is a 70 yo male who was admitted s/p L4-L5 decompression. PMH includes HTN and arthritis.  Clinical Impression  Pt admitted with the above. Handout was provided and reviewed with pt. Pt demonstrated knowledge of precautions during functional mobility. PTA pt was independent with all ADLs and driving. Lives at home with wife and 2 adult children who are available for assistance as needed. No follow-up PT recommended and all acute education complete at this time. PT signing off. Please reconsult as necessary in the future.     Follow Up Recommendations No PT follow up    Equipment Recommendations  None recommended by PT    Recommendations for Other Services       Precautions / Restrictions Precautions Precautions: Fall;Back Precaution Booklet Issued: Yes (comment) Precaution Comments: Pt able to recall 2/3 back precautions. Reviewed all precautions with pt. Restrictions Weight Bearing Restrictions: No      Mobility  Bed Mobility Overal bed mobility: Modified Independent             General bed mobility comments: Pt demonstrated bed mobility while following precautions without prompting. Required increased time.   Transfers Overall transfer level: Modified independent Equipment used: None             General transfer comment: Pt able to transfer sit<>stand safely.   Ambulation/Gait Ambulation/Gait assistance: Modified independent (Device/Increase time) Ambulation Distance (Feet): 200 Feet Assistive device: None Gait Pattern/deviations: WFL(Within Functional Limits);Step-through pattern   Gait velocity interpretation: at or above normal speed for age/gender General Gait Details: Pt able to manage IV pole safely while ambulating, without relying on for balance.  No LOB  noted.  Stairs Stairs: Yes Stairs assistance: Modified independent (Device/Increase time) Stair Management: One rail Left;Alternating pattern Number of Stairs: 4 General stair comments: No LOB noted. Pt stated "that was easy, no problem".  Wheelchair Mobility    Modified Rankin (Stroke Patients Only)       Balance Overall balance assessment: Modified Independent                                           Pertinent Vitals/Pain Pain Assessment: 0-10 Pain Score: 1  Pain Location: back Pain Descriptors / Indicators: Sore Pain Intervention(s): Monitored during session    Home Living Family/patient expects to be discharged to:: Private residence Living Arrangements: Spouse/significant other;Children Available Help at Discharge: Family Type of Home: Mobile home Home Access: Stairs to enter Entrance Stairs-Rails: Left Entrance Stairs-Number of Steps: 4 Home Layout: One level Home Equipment: Cane - single point Additional Comments: Pt lives at home with his wife and 2 adult children    Prior Function Level of Independence: Independent               Hand Dominance   Dominant Hand: Right    Extremity/Trunk Assessment   Upper Extremity Assessment: Overall WFL for tasks assessed           Lower Extremity Assessment: Overall WFL for tasks assessed      Cervical / Trunk Assessment: Other exceptions  Communication   Communication: HOH  Cognition Arousal/Alertness: Awake/alert Behavior During Therapy: WFL for tasks assessed/performed Overall Cognitive Status: Within Functional Limits for tasks assessed  General Comments General comments (skin integrity, edema, etc.): Pt able to recall all precautions at end of session, and was able to explain how he would return to bed following precautions.      Exercises        Assessment/Plan    PT Assessment Patent does not need any further PT services  PT Diagnosis  Difficulty walking   PT Problem List    PT Treatment Interventions     PT Goals (Current goals can be found in the Care Plan section) Acute Rehab PT Goals Patient Stated Goal: To return back home    Frequency     Barriers to discharge        Co-evaluation               End of Session Equipment Utilized During Treatment: Gait belt Activity Tolerance: Patient tolerated treatment well Patient left: in chair;with call bell/phone within reach Nurse Communication: Mobility status         Time: [id number] PT Time Calculation (min) (ACUTE ONLY): 24 min   Charges:   PT Evaluation $PT Eval Moderate Complexity: 1 Procedure PT Treatments $Gait Training: 8-22 mins   PT G Codes:        Fraya Ueda 07/22/2016, 10:00 AM Park Literara A Jermari Tamargo, SPT (student physical therapist) Acute Rehabilitation Services [phone]

## 2016-07-22 NOTE — Discharge Instructions (Signed)
Spinal Fusion, Care After Refer to this sheet in the next few weeks. These instructions provide you with information on caring for yourself after your procedure. Your caregiver may also give you more specific instructions. Your treatment has been planned according to current medical practices, but problems sometimes occur. Call your caregiver if you have any problems or questions after your procedure. HOME CARE INSTRUCTIONS   Take whatever pain medicine has been prescribed by your caregiver. Do not take over-the-counter pain medicine unless directed otherwise by your caregiver.  Do not drive if you are taking narcotic pain medicines.  Change your bandage (dressing) if necessary or as directed by your caregiver.  Do not get your surgical cut (incision) wet. After a few days you may take quick showers (rather than baths), but keep your incision clean and dry. Covering the incision with plastic wrap while you shower should keep your incision dry. A few weeks after surgery, once your incision has healed and your caregiver says it is okay, you can take baths or go swimming.  If you have been prescribed medicine to prevent your blood from clotting, follow the directions carefully.  Check the area around your incision often. Look for redness and swelling. Also, look for anything leaking from your wound. You can use a mirror or have a family member inspect your incision if it is in a place where it is difficult for you to see.  Ask your caregiver what activities you should avoid and for how long.  Walk as much as possible.  Do not lift anything heavier than 10 pounds (4.5 kilograms) until your caregiver says it is safe.  Do not twist or bend for a few weeks. Try not to pull on things. Avoid sitting for long periods of time. Change positions at least every hour.  Ask your caregiver what kinds of exercise you should do to make your back stronger and when you should begin doing these exercises. SEEK  IMMEDIATE MEDICAL CARE IF:   Pain suddenly becomes much worse.  The incision area is red, swollen, bleeding, or leaking fluid.  Your legs or feet become increasingly painful, numb, weak, or swollen.  You have trouble controlling urination or bowel movements.  You have trouble breathing.  You have chest pain.  You have a fever. MAKE SURE YOU:  Understand these instructions.  Will watch your condition.  Will get help right away if you are not doing well or get worse.   This information is not intended to replace advice given to you by your health care provider. Make sure you discuss any questions you have with your health care provider.   Document Released: 06/12/2005 Document Revised: 12/14/2014 Document Reviewed: 05/08/2015 Elsevier Interactive Patient Education 2016 Elsevier Inc. Laminectomy During a laminectomy, small pieces of bone in the spine called lamina are removed. The ligaments underneath the lamina and parts of the joints that have grown too large are also removed. This takes pressure off the nerves.  [facility] KNOW ABOUT:  Any allergies you have.  All medicines you are taking, including vitamins, herbs, eye drops, creams, and over-the-counter medicines.  Previous problems you or members of your family have had with the use of anesthetics.  Any blood disorders you have.  Previous surgeries you have had.  Medical conditions you have. RISKS AND COMPLICATIONS  Generally, laminectomy is a safe procedure. However, as with any procedure, complications can occur. Possible complications include:  Infection near the incision.  Nerve damage. Signs  of this can be pain, weakness, or numbness.  Leaking of spinal fluid.  Blood clot in a leg. The clot can move to the lungs. This can be very serious.  Bowel or bladder incontinence (rare). BEFORE THE PROCEDURE   You will need to stop taking certain medicines as directed by your health care  provider.  If you smoke, stop at least 2 weeks before the procedure. Smoking can slow down the healing process and increase the risk of complications.  Do not eat or drink anything for at least 8 hours before the procedure. Take any medicines that your health care provider tells you to keep taking with a sip of water.  Do not drink alcohol the day before your surgery.  Tell your health care provider if you develop a cold or any infection before your surgery.  Arrange for someone to drive you home after the procedure or after your hospital stay. Also arrange for someone to help you with activities during recovery. PROCEDURE  Small monitors will be placed on your body. They are used to check your heart, blood pressure, and oxygen level.  An IV tube will be inserted into one of your veins. Medicine will flow directly into your body through the IV tube.  You might be given a sedative. This will help you relax.  You will be given a medicine to make you sleep (general anesthetic), and a breathing tube will be placed into your lungs. During general anesthesia, you are unaware of the procedure and do not feel any pain.  Your back will be cleaned with a special solution to kill germs on your skin.  Once you are asleep, the surgeon will make a 2-inch to 5-inch cut (incision) in your back. The length of the incision will depend on how many spinal bones (vertebrae) are being operated on.  Muscles in the back will be moved away from the vertebrae and pulled to the side.  Pieces of lamina will be removed.  The ligament that lies under the lamina and connects your vertebrae will be removed.  Enough ligaments and thickened joints will be removed to take pressure off your nerves.  Your nerves will be identified, and their passage will be tracked and assessed for excessive tightness.  Your back muscles will be moved back into their normal position.  The area under your skin will be closed with  small, absorbable stitches. These stitches do not need to be removed.  Your skin will be closed with small absorbable stitches or staples.  A dressing will be put over your incision.  The procedure may take 1-3 hours. AFTER THE PROCEDURE   You will stay in a recovery area until the anesthesia has worn off. Your blood pressure and pulse will be checked every so often. Then you will be taken to a hospital room.  You may continue to get fluids through the IV tube for a while.  Some pain is normal. You may be given pain medicine while still in the recovery area.  It is important to be up and moving as soon as possible after a surgery. Physical therapists will help you start walking.  To prevent blood clots in your legs:  You may be given special stockings to wear.  You may need to take medicine to prevent clots.  You may be asked to do special breathing exercises to re-expand your lungs. This is to prevent a lung infection.  Most people stay in the hospital for 1-3 days after  a laminectomy.   This information is not intended to replace advice given to you by your health care provider. Make sure you discuss any questions you have with your health care provider.   Document Released: 11/11/2009 Document Revised: 09/13/2013 Document Reviewed: 07/05/2013 Elsevier Interactive Patient Education [employer]2016 Elsevier Inc.

## 2016-07-22 NOTE — Care Management Note (Signed)
Case Management Note  Patient Details  Name: Adam Bradshaw MRN: [id number] Date of Birth: Jan 01, 1946  Subjective/Objective:                    Action/Plan: Pt discharging home with self care. No further needs per CM.   Expected Discharge Date:                  Expected Discharge Plan:     In-House Referral:     Discharge planning Services     Post Acute Care Choice:    Choice offered to:     DME Arranged:    DME Agency:     HH Arranged:    HH Agency:     Status of Service:  In process, will continue to follow  If discussed at Long Length of Stay Meetings, dates discussed:    Additional Comments:  [name], RN 07/22/2016, 11:58 AM

## 2016-07-22 NOTE — Care Management Note (Signed)
Case Management Note  Patient Details  Name: Adam GardenerRoger D Buczek MRN: [id number] Date of Birth: [birth year]  Subjective/Objective:    Pt underwent: Lumbar four-five Laminectomy. He is from home with his spouse.               Action/Plan: Awaiting PT/OT recommendations. CM following for discharge disposition.   Expected Discharge Date:                  Expected Discharge Plan:     In-House Referral:     Discharge planning Services     Post Acute Care Choice:    Choice offered to:     DME Arranged:    DME Agency:     HH Arranged:    HH Agency:     Status of Service:  In process, will continue to follow  If discussed at Long Length of Stay Meetings, dates discussed:    Additional Comments:  Kermit BaloKelli F Tyrese Ficek, RN 07/22/2016, 10:47 AM

## 2016-08-24 ENCOUNTER — Other Ambulatory Visit: Payer: Self-pay | Admitting: Neurosurgery

## 2016-08-24 ENCOUNTER — Encounter (HOSPITAL_COMMUNITY): Payer: Self-pay | Admitting: *Deleted

## 2016-08-24 NOTE — H&P (Signed)
Adam GardenerRoger D Bradshaw is an 70 y.o. male.   Chief Complaint: bilateral leg pain. HPI: patient who went home 24 hours after decompression at lumbar 4-5 secondary to stenosis. Had headache later on but that got better and he was seen by me last week. He has been complaining of lumbar pain with radiation to both legs.mri was done which showd epidural fluid collection most likely csf  Past Medical History:  Diagnosis Date  . Arthritis   . Headache   . Hypertension     Past Surgical History:  Procedure Laterality Date  . APPENDECTOMY    . basal skin surgery x2    . CERVICAL DISC SURGERY    . HAND SURGERY Right   . LUMBAR LAMINECTOMY/DECOMPRESSION MICRODISCECTOMY N/A 07/21/2016   Procedure: Lumbar four-five Laminectomy;  Surgeon: Hilda LiasErnesto Rolanda Campa, MD;  Location: [facility];  Service: Neurosurgery;  Laterality: N/A;  . SHOULDER SURGERY Right     No family history on file. Social History:  reports that he quit smoking about 41 years ago. His smoking use included Cigarettes. He smoked 0.25 packs per day. He has quit using smokeless tobacco. He reports that he does not drink alcohol or use drugs.  Allergies:  Allergies  Allergen Reactions  . No Known Allergies     No prescriptions prior to admission.    No results found for this or any previous visit (from the past 48 hour(s)). No results found.  Review of Systems  Constitutional: Negative.   HENT: Positive for hearing loss.   Eyes: Negative.   Respiratory: Negative.   Cardiovascular: Negative.   Gastrointestinal: Negative.   Genitourinary: Negative.   Musculoskeletal: Positive for back pain.  Skin: Negative.   Neurological: Positive for sensory change and headaches.  Endo/Heme/Allergies: Negative.   Psychiatric/Behavioral: Negative.     There were no vitals taken for this visit. Physical Exam  Hent, nl. Neck, no stiffness. Cv, nl. Lungs clear. Abdomen, nl. Extremities, nl. NEURO decrease of hearing. No weakness. Lumbar wound flat  and dry Assessment/Plan Exploration of lumbar wound  . He and his wife are aware of our preop concerns  Adam CassisBOTERO,Adam Bolin M, MD 08/24/2016, 5:41 PM

## 2016-08-25 ENCOUNTER — Inpatient Hospital Stay (HOSPITAL_COMMUNITY): Payer: Medicare Other | Admitting: Anesthesiology

## 2016-08-25 ENCOUNTER — Encounter (HOSPITAL_COMMUNITY): Payer: Self-pay | Admitting: *Deleted

## 2016-08-25 ENCOUNTER — Inpatient Hospital Stay (HOSPITAL_COMMUNITY)
Admission: RE | Admit: 2016-08-25 | Discharge: 2016-08-30 | DRG: 030 | Disposition: A | Discharge status: Home / Self Care | Payer: Medicare Other | Source: Ambulatory Visit | Attending: Neurosurgery | Admitting: Neurosurgery

## 2016-08-25 ENCOUNTER — Encounter (HOSPITAL_COMMUNITY)
Admission: RE | Disposition: A | Discharge status: Home / Self Care | Payer: Self-pay | Source: Ambulatory Visit | Attending: Neurosurgery

## 2016-08-25 DIAGNOSIS — M4806 Spinal stenosis, lumbar region: Secondary | ICD-10-CM | POA: Diagnosis not present

## 2016-08-25 DIAGNOSIS — R7303 Prediabetes: Secondary | ICD-10-CM | POA: Diagnosis not present

## 2016-08-25 DIAGNOSIS — M1990 Unspecified osteoarthritis, unspecified site: Secondary | ICD-10-CM | POA: Diagnosis present

## 2016-08-25 DIAGNOSIS — M5416 Radiculopathy, lumbar region: Secondary | ICD-10-CM | POA: Diagnosis present

## 2016-08-25 DIAGNOSIS — G960 Cerebrospinal fluid leak: Principal | ICD-10-CM | POA: Diagnosis present

## 2016-08-25 DIAGNOSIS — I10 Essential (primary) hypertension: Secondary | ICD-10-CM | POA: Diagnosis present

## 2016-08-25 DIAGNOSIS — Z87891 Personal history of nicotine dependence: Secondary | ICD-10-CM

## 2016-08-25 HISTORY — DX: Prediabetes: R73.03

## 2016-08-25 HISTORY — PX: LUMBAR WOUND DEBRIDEMENT: SHX1988

## 2016-08-25 LAB — BASIC METABOLIC PANEL
Anion gap: 10 (ref 5–15)
BUN: 11 mg/dL (ref 6–20)
CO2: 25 mmol/L (ref 22–32)
Calcium: 9.1 mg/dL (ref 8.9–10.3)
Chloride: 102 mmol/L (ref 101–111)
Creatinine, Ser: 0.8 mg/dL (ref 0.61–1.24)
GFR calc Af Amer: >60 mL/min (ref >60)
GFR calc non Af Amer: >60 mL/min (ref >60)
Glucose, Bld: 99 mg/dL (ref 65–99)
Potassium: 4.2 mmol/L (ref 3.5–5.1)
Sodium: 137 mmol/L (ref 135–145)

## 2016-08-25 LAB — SURGICAL PCR SCREEN
MRSA, PCR: NEGATIVE
Staphylococcus aureus: NEGATIVE

## 2016-08-25 LAB — CBC
HCT: 41.8 % (ref 39.0–52.0)
Hemoglobin: 14.2 g/dL (ref 13.0–17.0)
MCH: 31 pg (ref 26.0–34.0)
MCHC: 34 g/dL (ref 30.0–36.0)
MCV: 91.3 fL (ref 78.0–100.0)
Platelets: 189 10*3/uL (ref 150–400)
RBC: 4.58 MIL/uL (ref 4.22–5.81)
RDW: 11.8 % (ref 11.5–15.5)
WBC: 6.9 10*3/uL (ref 4.0–10.5)

## 2016-08-25 SURGERY — LUMBAR WOUND DEBRIDEMENT
Anesthesia: General | Site: Spine Lumbar

## 2016-08-25 MED ORDER — MENTHOL 3 MG MT LOZG: 1.0000 | LOZENGE | OROMUCOSAL | Status: DC | PRN

## 2016-08-25 MED ORDER — MUPIROCIN 2 % EX OINT
TOPICAL_OINTMENT | CUTANEOUS | Status: AC
  Administered 2016-08-25: 1 via TOPICAL
  Filled 2016-08-25: qty 22

## 2016-08-25 MED ORDER — FENTANYL CITRATE (PF) 100 MCG/2ML IJ SOLN
25.0000 ug | INTRAMUSCULAR | Status: DC | PRN
  Administered 2016-08-25 (×3): 50 ug via INTRAVENOUS

## 2016-08-25 MED ORDER — FENTANYL CITRATE (PF) 100 MCG/2ML IJ SOLN
INTRAMUSCULAR | Status: AC
  Filled 2016-08-25: qty 2

## 2016-08-25 MED ORDER — FENTANYL CITRATE (PF) 100 MCG/2ML IJ SOLN
INTRAMUSCULAR | Status: DC | PRN
  Administered 2016-08-25 (×2): 50 ug via INTRAVENOUS
  Administered 2016-08-25: 200 ug via INTRAVENOUS
  Administered 2016-08-25: 100 ug via INTRAVENOUS

## 2016-08-25 MED ORDER — GABAPENTIN 300 MG PO CAPS
300.0000 mg | ORAL_CAPSULE | Freq: Three times a day (TID) | ORAL | Status: DC
  Administered 2016-08-25 – 2016-08-30 (×14): 300 mg via ORAL
  Filled 2016-08-25 (×14): qty 1

## 2016-08-25 MED ORDER — HEPARIN SODIUM (PORCINE) 5000 UNIT/ML IJ SOLN
5000.0000 [IU] | Freq: Three times a day (TID) | INTRAMUSCULAR | Status: DC
  Administered 2016-08-25 – 2016-08-30 (×13): 5000 [IU] via SUBCUTANEOUS
  Filled 2016-08-25 (×13): qty 1

## 2016-08-25 MED ORDER — DIAZEPAM 5 MG PO TABS
10.0000 mg | ORAL_TABLET | Freq: Four times a day (QID) | ORAL | Status: DC | PRN
  Administered 2016-08-25 – 2016-08-26 (×2): 10 mg via ORAL
  Filled 2016-08-25 (×2): qty 2

## 2016-08-25 MED ORDER — PHENYLEPHRINE 40 MCG/ML (10ML) SYRINGE FOR IV PUSH (FOR BLOOD PRESSURE SUPPORT)
PREFILLED_SYRINGE | INTRAVENOUS | Status: DC | PRN
  Administered 2016-08-25: 80 ug via INTRAVENOUS
  Administered 2016-08-25: 120 ug via INTRAVENOUS
  Administered 2016-08-25: 80 ug via INTRAVENOUS

## 2016-08-25 MED ORDER — ONDANSETRON HCL 4 MG/2ML IJ SOLN: 4.0000 mg | Freq: Once | INTRAMUSCULAR | Status: DC | PRN

## 2016-08-25 MED ORDER — FENTANYL CITRATE (PF) 100 MCG/2ML IJ SOLN: 25.0000 ug | INTRAMUSCULAR | Status: DC | PRN

## 2016-08-25 MED ORDER — LIDOCAINE 2% (20 MG/ML) 5 ML SYRINGE
INTRAMUSCULAR | Status: AC
  Filled 2016-08-25: qty 5

## 2016-08-25 MED ORDER — THROMBIN 5000 UNITS EX SOLR
CUTANEOUS | Status: DC | PRN
  Administered 2016-08-25 (×2): 5000 [IU] via TOPICAL

## 2016-08-25 MED ORDER — HEMOSTATIC AGENTS (NO CHARGE) OPTIME
TOPICAL | Status: DC | PRN
  Administered 2016-08-25: 1 via TOPICAL

## 2016-08-25 MED ORDER — SODIUM CHLORIDE 0.9 % IV SOLN
INTRAVENOUS | Status: DC
  Administered 2016-08-26 – 2016-08-27 (×2): 1000 mL via INTRAVENOUS

## 2016-08-25 MED ORDER — 0.9 % SODIUM CHLORIDE (POUR BTL) OPTIME
TOPICAL | Status: DC | PRN
  Administered 2016-08-25: 1000 mL

## 2016-08-25 MED ORDER — ROCURONIUM BROMIDE 10 MG/ML (PF) SYRINGE
PREFILLED_SYRINGE | INTRAVENOUS | Status: AC
  Filled 2016-08-25: qty 10

## 2016-08-25 MED ORDER — PHENOL 1.4 % MT LIQD: 1.0000 | OROMUCOSAL | Status: DC | PRN

## 2016-08-25 MED ORDER — ACETAMINOPHEN 325 MG PO TABS
650.0000 mg | ORAL_TABLET | ORAL | Status: DC | PRN
  Administered 2016-08-26 – 2016-08-27 (×2): 650 mg via ORAL
  Filled 2016-08-25 (×2): qty 2

## 2016-08-25 MED ORDER — FENTANYL CITRATE (PF) 100 MCG/2ML IJ SOLN
INTRAMUSCULAR | Status: AC
  Administered 2016-08-25: 50 ug via INTRAVENOUS
  Filled 2016-08-25: qty 2

## 2016-08-25 MED ORDER — SUGAMMADEX SODIUM 200 MG/2ML IV SOLN
INTRAVENOUS | Status: DC | PRN
  Administered 2016-08-25: 190 mg via INTRAVENOUS

## 2016-08-25 MED ORDER — LACTATED RINGERS IV SOLN
INTRAVENOUS | Status: DC
Start: 2016-08-25 — End: 2016-08-30
  Administered 2016-08-25 (×3): via INTRAVENOUS

## 2016-08-25 MED ORDER — OXYCODONE-ACETAMINOPHEN 5-325 MG PO TABS
1.0000 | ORAL_TABLET | ORAL | Status: DC | PRN
  Administered 2016-08-25 – 2016-08-26 (×2): 2 via ORAL
  Filled 2016-08-25 (×2): qty 2

## 2016-08-25 MED ORDER — EPHEDRINE 5 MG/ML INJ
INTRAVENOUS | Status: AC
  Filled 2016-08-25: qty 10

## 2016-08-25 MED ORDER — LOSARTAN POTASSIUM 50 MG PO TABS
100.0000 mg | ORAL_TABLET | Freq: Every day | ORAL | Status: DC
  Administered 2016-08-26 – 2016-08-30 (×5): 100 mg via ORAL
  Filled 2016-08-25 (×5): qty 2

## 2016-08-25 MED ORDER — ROSUVASTATIN CALCIUM 10 MG PO TABS
10.0000 mg | ORAL_TABLET | Freq: Every day | ORAL | Status: DC
  Administered 2016-08-26 – 2016-08-30 (×5): 10 mg via ORAL
  Filled 2016-08-25 (×5): qty 1

## 2016-08-25 MED ORDER — SUGAMMADEX SODIUM 200 MG/2ML IV SOLN
INTRAVENOUS | Status: AC
  Filled 2016-08-25: qty 2

## 2016-08-25 MED ORDER — ONDANSETRON HCL 4 MG/2ML IJ SOLN
INTRAMUSCULAR | Status: DC | PRN
  Administered 2016-08-25: 4 mg via INTRAVENOUS

## 2016-08-25 MED ORDER — DIPHENHYDRAMINE HCL 50 MG/ML IJ SOLN: 12.5000 mg | Freq: Four times a day (QID) | INTRAMUSCULAR | Status: DC | PRN

## 2016-08-25 MED ORDER — DEXTROSE 5 % IV SOLN
INTRAVENOUS | Status: DC | PRN
  Administered 2016-08-25: 40 ug/min via INTRAVENOUS

## 2016-08-25 MED ORDER — PROPOFOL 10 MG/ML IV BOLUS
INTRAVENOUS | Status: DC | PRN
  Administered 2016-08-25: 140 mg via INTRAVENOUS

## 2016-08-25 MED ORDER — LIDOCAINE 2% (20 MG/ML) 5 ML SYRINGE
INTRAMUSCULAR | Status: DC | PRN
  Administered 2016-08-25: 100 mg via INTRAVENOUS

## 2016-08-25 MED ORDER — ONDANSETRON HCL 4 MG/2ML IJ SOLN
4.0000 mg | Freq: Four times a day (QID) | INTRAMUSCULAR | Status: DC | PRN
  Administered 2016-08-25: 4 mg via INTRAVENOUS
  Filled 2016-08-25 (×3): qty 2

## 2016-08-25 MED ORDER — THROMBIN 5000 UNITS EX SOLR
OROMUCOSAL | Status: DC | PRN
  Administered 2016-08-25: 18:00:00 via TOPICAL

## 2016-08-25 MED ORDER — SODIUM CHLORIDE 0.9% FLUSH: 9.0000 mL | INTRAVENOUS | Status: DC | PRN

## 2016-08-25 MED ORDER — DIPHENHYDRAMINE HCL 12.5 MG/5ML PO ELIX: 12.5000 mg | ORAL_SOLUTION | Freq: Four times a day (QID) | ORAL | Status: DC | PRN

## 2016-08-25 MED ORDER — SODIUM CHLORIDE 0.9% FLUSH: 3.0000 mL | INTRAVENOUS | Status: DC | PRN

## 2016-08-25 MED ORDER — MORPHINE SULFATE 2 MG/ML IV SOLN
INTRAVENOUS | Status: DC
Start: 2016-08-26 — End: 2016-08-27
  Administered 2016-08-26: 12 mg via INTRAVENOUS
  Administered 2016-08-26: 10.5 mg via INTRAVENOUS
  Administered 2016-08-26: 4.5 mg via INTRAVENOUS
  Administered 2016-08-26: 3 mg via INTRAVENOUS
  Administered 2016-08-27: 11:00:00 via INTRAVENOUS
  Administered 2016-08-27: 0 mg via INTRAVENOUS
  Filled 2016-08-25 (×2): qty 25

## 2016-08-25 MED ORDER — MUPIROCIN 2 % EX OINT
1.0000 "application " | TOPICAL_OINTMENT | Freq: Once | CUTANEOUS | Status: AC
  Administered 2016-08-25: 1 via TOPICAL

## 2016-08-25 MED ORDER — SODIUM CHLORIDE 0.9% FLUSH
3.0000 mL | Freq: Two times a day (BID) | INTRAVENOUS | Status: DC
  Administered 2016-08-25 – 2016-08-29 (×5): 3 mL via INTRAVENOUS

## 2016-08-25 MED ORDER — ROCURONIUM BROMIDE 100 MG/10ML IV SOLN
INTRAVENOUS | Status: DC | PRN
  Administered 2016-08-25: 40 mg via INTRAVENOUS

## 2016-08-25 MED ORDER — PROPOFOL 10 MG/ML IV BOLUS
INTRAVENOUS | Status: AC
  Filled 2016-08-25: qty 20

## 2016-08-25 MED ORDER — CEFAZOLIN IN D5W 1 GM/50ML IV SOLN
1.0000 g | Freq: Three times a day (TID) | INTRAVENOUS | Status: AC
  Administered 2016-08-26 (×2): 1 g via INTRAVENOUS
  Filled 2016-08-25 (×2): qty 50

## 2016-08-25 MED ORDER — ONDANSETRON HCL 4 MG/2ML IJ SOLN
INTRAMUSCULAR | Status: AC
  Filled 2016-08-25: qty 6

## 2016-08-25 MED ORDER — SODIUM CHLORIDE 0.9 % IV SOLN: 250.0000 mL | INTRAVENOUS | Status: DC

## 2016-08-25 MED ORDER — TAMSULOSIN HCL 0.4 MG PO CAPS
0.4000 mg | ORAL_CAPSULE | Freq: Every day | ORAL | Status: DC
  Administered 2016-08-26 – 2016-08-30 (×5): 0.4 mg via ORAL
  Filled 2016-08-25 (×5): qty 1

## 2016-08-25 MED ORDER — NALOXONE HCL 0.4 MG/ML IJ SOLN: 0.4000 mg | INTRAMUSCULAR | Status: DC | PRN

## 2016-08-25 MED ORDER — CEFAZOLIN SODIUM-DEXTROSE 2-3 GM-% IV SOLR
INTRAVENOUS | Status: DC | PRN
  Administered 2016-08-25: 2 g via INTRAVENOUS

## 2016-08-25 MED ORDER — ACETAMINOPHEN 650 MG RE SUPP: 650.0000 mg | RECTAL | Status: DC | PRN

## 2016-08-25 MED ORDER — ONDANSETRON HCL 4 MG/2ML IJ SOLN
4.0000 mg | INTRAMUSCULAR | Status: DC | PRN
  Administered 2016-08-26 – 2016-08-27 (×3): 4 mg via INTRAVENOUS
  Filled 2016-08-25: qty 2

## 2016-08-25 SURGICAL SUPPLY — 49 items
ADH SKN CLS APL DERMABOND .7 (GAUZE/BANDAGES/DRESSINGS) ×1
APL SKNCLS STERI-STRIP NONHPOA (GAUZE/BANDAGES/DRESSINGS)
BENZOIN TINCTURE PRP APPL 2/3 (GAUZE/BANDAGES/DRESSINGS) IMPLANT
CANISTER SUCT 3000ML PPV (MISCELLANEOUS) ×3 IMPLANT
CLOSURE WOUND 1/2 X4 (GAUZE/BANDAGES/DRESSINGS)
DERMABOND ADVANCED (GAUZE/BANDAGES/DRESSINGS) ×2
DERMABOND ADVANCED .7 DNX12 (GAUZE/BANDAGES/DRESSINGS) IMPLANT
DRAPE LAPAROTOMY 100X72X124 (DRAPES) ×3 IMPLANT
DRAPE MICROSCOPE LEICA (MISCELLANEOUS) ×2 IMPLANT
DRAPE POUCH INSTRU U-SHP 10X18 (DRAPES) ×3 IMPLANT
DURAPREP 26ML APPLICATOR (WOUND CARE) IMPLANT
ELECT REM PT RETURN 9FT ADLT (ELECTROSURGICAL) ×3
ELECTRODE REM PT RTRN 9FT ADLT (ELECTROSURGICAL) ×1 IMPLANT
GAUZE SPONGE 4X4 12PLY STRL (GAUZE/BANDAGES/DRESSINGS) ×3 IMPLANT
GAUZE SPONGE 4X4 16PLY XRAY LF (GAUZE/BANDAGES/DRESSINGS) IMPLANT
GLOVE BIO SURGEON STRL SZ7 (GLOVE) ×2 IMPLANT
GLOVE BIO SURGEON STRL SZ7.5 (GLOVE) ×2 IMPLANT
GLOVE BIO SURGEON STRL SZ8 (GLOVE) IMPLANT
GLOVE BIOGEL M 8.0 STRL (GLOVE) ×3 IMPLANT
GLOVE BIOGEL PI IND STRL 8.5 (GLOVE) IMPLANT
GLOVE BIOGEL PI INDICATOR 8.5 (GLOVE) ×2
GLOVE ECLIPSE 8.5 STRL (GLOVE) ×2 IMPLANT
GLOVE INDICATOR 6.5 STRL GRN (GLOVE) ×2 IMPLANT
GOWN STRL REUS W/ TWL LRG LVL3 (GOWN DISPOSABLE) ×1 IMPLANT
GOWN STRL REUS W/ TWL XL LVL3 (GOWN DISPOSABLE) IMPLANT
GOWN STRL REUS W/TWL 2XL LVL3 (GOWN DISPOSABLE) ×2 IMPLANT
GOWN STRL REUS W/TWL LRG LVL3 (GOWN DISPOSABLE) ×3
GOWN STRL REUS W/TWL XL LVL3 (GOWN DISPOSABLE) ×3
HEMOSTAT POWDER KIT SURGIFOAM (HEMOSTASIS) ×2 IMPLANT
KIT BASIN OR (CUSTOM PROCEDURE TRAY) ×3 IMPLANT
KIT ROOM TURNOVER OR (KITS) ×3 IMPLANT
NS IRRIG 1000ML POUR BTL (IV SOLUTION) ×3 IMPLANT
PACK LAMINECTOMY NEURO (CUSTOM PROCEDURE TRAY) ×3 IMPLANT
PAD ARMBOARD 7.5X6 YLW CONV (MISCELLANEOUS) ×9 IMPLANT
RUBBERBAND STERILE (MISCELLANEOUS) ×4 IMPLANT
SEALANT ADHERUS EXTEND TIP (MISCELLANEOUS) ×2 IMPLANT
SPONGE SURGIFOAM ABS GEL SZ50 (HEMOSTASIS) ×3 IMPLANT
STAPLER VISISTAT 35W (STAPLE) ×2 IMPLANT
STRIP CLOSURE SKIN 1/2X4 (GAUZE/BANDAGES/DRESSINGS) IMPLANT
SUT PROLENE 6 0 BV (SUTURE) ×6 IMPLANT
SUT VIC AB 0 CT1 18XCR BRD8 (SUTURE) ×1 IMPLANT
SUT VIC AB 0 CT1 8-18 (SUTURE) ×3
SUT VIC AB 2-0 CP2 18 (SUTURE) ×3 IMPLANT
SUT VIC AB 3-0 SH 8-18 (SUTURE) ×3 IMPLANT
SWAB COLLECTION DEVICE MRSA (MISCELLANEOUS) ×3 IMPLANT
SWAB CULTURE ESWAB REG 1ML (MISCELLANEOUS) ×3 IMPLANT
TOWEL OR 17X24 6PK STRL BLUE (TOWEL DISPOSABLE) ×3 IMPLANT
TOWEL OR 17X26 10 PK STRL BLUE (TOWEL DISPOSABLE) ×3 IMPLANT
WATER STERILE IRR 1000ML POUR (IV SOLUTION) ×3 IMPLANT

## 2016-08-25 NOTE — Anesthesia Procedure Notes (Signed)
Procedure Name: Intubation Date/Time: 08/25/2016 5:00 PM Performed by: Orvilla FusATO, Dwana Garin A Pre-anesthesia Checklist: Patient identified, Emergency Drugs available, Suction available and Patient being monitored Patient Re-evaluated:Patient Re-evaluated prior to inductionOxygen Delivery Method: Circle System Utilized Preoxygenation: Pre-oxygenation with 100% oxygen Intubation Type: IV induction Ventilation: Mask ventilation without difficulty and Oral airway inserted - appropriate to patient size Laryngoscope Size: Glidescope Grade View: Grade I Tube type: Oral Tube size: 7.5 mm Number of attempts: 1 Airway Equipment and Method: Stylet and Oral airway Placement Confirmation: ETT inserted through vocal cords under direct vision,  positive ETCO2 and breath sounds checked- equal and bilateral Secured at: 23 cm Tube secured with: Tape Dental Injury: Teeth and Oropharynx as per pre-operative assessment

## 2016-08-25 NOTE — Transfer of Care (Signed)
Immediate Anesthesia Transfer of Care Note  Patient: Adam GardenerRoger D Bradshaw  Procedure(s) Performed: Procedure(s) with comments: Exploration of lumbar wound (N/A) - Exploration of lumbar wound  Patient Location: PACU  Anesthesia Type:General  Level of Consciousness: awake, alert  and oriented  Airway & Oxygen Therapy: Patient Spontanous Breathing and Patient connected to nasal cannula oxygen  Post-op Assessment: Report given to RN, Post -op Vital signs reviewed and stable and Patient moving all extremities  Post vital signs: Reviewed and stable  Last Vitals:  Vitals:   08/25/16 1313 08/25/16 1845  BP: (!) 150/100 (!) 123/100  Pulse: 81 68  Resp: 18 (!) 21  Temp: 36.4 C 36.3 C    Last Pain:  Vitals:   08/25/16 1335  TempSrc:   PainSc: 5       Patients Stated Pain Goal: 5 (08/25/16 1335)  Complications: No apparent anesthesia complications

## 2016-08-25 NOTE — Anesthesia Postprocedure Evaluation (Signed)
Anesthesia Post Note  Patient: Adam Bradshaw  Procedure(s) Performed: Procedure(s) (LRB): Exploration of lumbar wound (N/A)  Patient location during evaluation: PACU Anesthesia Type: General Level of consciousness: awake and alert Pain management: pain level controlled Vital Signs Assessment: post-procedure vital signs reviewed and stable Respiratory status: spontaneous breathing, nonlabored ventilation, respiratory function stable and patient connected to nasal cannula oxygen Cardiovascular status: blood pressure returned to baseline and stable Postop Assessment: no signs of nausea or vomiting Anesthetic complications: no    Last Vitals:  Vitals:   08/25/16 1845 08/25/16 1854  BP: (!) 123/100 (!) 141/79  Pulse: 68 65  Resp: (!) 21 (!) 7  Temp: 36.3 C     Last Pain:  Vitals:   08/25/16 1854  TempSrc:   PainSc: 8                  Reino KentJudd, [place] Cloe J

## 2016-08-25 NOTE — Progress Notes (Signed)
Pt arrived from PACU around 2000 orders for flat bed rest for 2 days post surgery, complaining of pain 8 on 0-10 scale, has only PO percocet 5-325 X 2 q 4 for pain, alert and oriented moves all extremities no loss of sensation, will continue to monitor.

## 2016-08-25 NOTE — Anesthesia Preprocedure Evaluation (Addendum)
Anesthesia Evaluation  Patient identified by MRN, date of birth, ID band Patient awake    Reviewed: Allergy & Precautions, NPO status , Patient's Chart, lab work & pertinent test results  History of Anesthesia Complications Negative for: history of anesthetic complications  Airway Mallampati: III  TM Distance: >3 FB Neck ROM: Full   Comment: Previous grade I view with glidescope 4 Dental  (+) Partial Lower, Partial Upper, Dental Advisory Given   Pulmonary neg shortness of breath, neg sleep apnea, neg COPD, neg recent URI, former smoker,    Pulmonary exam normal breath sounds clear to auscultation       Cardiovascular hypertension, Pt. 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    GI/Hepatic negative GI ROS, Neg liver ROS,   Endo/Other  prediabetes  Renal/GU negative Renal ROS     Musculoskeletal  (+) Arthritis ,   Abdominal (+) + obese,   Peds  Hematology negative hematology ROS (+)   Anesthesia Other Findings   Reproductive/Obstetrics                            Anesthesia Physical Anesthesia Plan  ASA: III  Anesthesia Plan: General   Post-op Pain Management:    Induction: Intravenous  Airway Management Planned: Oral ETT  Additional Equipment:   Intra-op Plan:   Post-operative Plan: Extubation in OR  Informed Consent: I have reviewed the patients History and Physical, chart, labs and discussed the procedure including the risks, benefits and alternatives for the proposed anesthesia with the patient or authorized representative who has indicated his/her understanding and acceptance.   Dental advisory given  Plan Discussed with: CRNA  Anesthesia Plan Comments: (Risks of general  anesthesia discussed including, but not limited to, sore throat, hoarse voice, chipped/damaged teeth, injury to vocal cords, nausea and vomiting, allergic reactions, lung infection, heart attack, stroke, and death. All questions answered. )       Anesthesia Quick Evaluation

## 2016-08-26 ENCOUNTER — Encounter (HOSPITAL_COMMUNITY): Payer: Self-pay | Admitting: General Practice

## 2016-08-26 MED ORDER — DIAZEPAM 5 MG PO TABS
5.0000 mg | ORAL_TABLET | Freq: Four times a day (QID) | ORAL | Status: DC
  Administered 2016-08-26 – 2016-08-30 (×13): 5 mg via ORAL
  Filled 2016-08-26 (×13): qty 1

## 2016-08-26 NOTE — Progress Notes (Signed)
Patient ID: Adam Bradshaw, male   DOB: [birth year], 70 y.o.   MRN: [id number] Found patient flat in bed with a pillow under head. I spoke with wife and patient as well as his nurse about the NEED to stay 10 degrees down to allow the area to heal. No weakness. Denies headache

## 2016-08-26 NOTE — Op Note (Signed)
NAME:  Adam Bradshaw, Adam Bradshaw                 ACCOUNT NO.:  [id number]  MEDICAL RECORD NO.:  [id number]  LOCATION:  5C06C                        FACILITY:  [facility]  PHYSICIAN:  Hilda LiasErnesto Brylynn Hanssen, M.D.   DATE OF BIRTH:  Mar 12, 1946  DATE OF PROCEDURE:  08/25/2016 DATE OF DISCHARGE:                              OPERATIVE REPORT   PREOPERATIVE DIAGNOSIS:  Lumbar cerebrospinal fluid leak, status post lumbar laminectomy for stenosis.  POSTOPERATIVE DIAGNOSIS:  Lumbar cerebrospinal fluid leak, status post lumbar laminectomy for stenosis.  PROCEDURES:  Exploration of lumbar wound.  Closure of durotomy. Microscope.  SURGEON:  Hilda LiasErnesto Merric Yost, M.D.  CLINICAL HISTORY:  Adam Bradshaw is a gentleman who underwent lumbar decompression because of stenosis.  The patient did well, went home 24 hours later and since then, he did well for a few days, but later on, he developed some mild headache.  I saw him in my office twice, the wound was flat.  Lately according to his wife, he keeps falling, he hit his head.  We did a MRI, which showed quite a bit of fluid in the epidural space.  The wound looks normal.  Surgery to investigate the area was advised.  DESCRIPTION OF PROCEDURE:  The patient was taken to the OR, and he was positioned in a prone manner.  The back was cleaned with DuraPrep. Midline incision was made.  We opened the skin, subcutaneous space, and only, we opened this fascia, we found quite a bit of fluid collection. The area was drained.  We brought the microscope into the area.  There was one opening underneath the ligament between L3 and the previous L4 lamina space.  With the Kerrison punch, we removed part of the bone of L3 and we used a single stitch of 6-0 Prolene to close the area.  There was another opening right at the level of takeoff of L5.  One single stitch of 6-0 Prolene was done.  At the end, we did Valsalva maneuver. Surgical adhesive was used as well of muscle graft.  The area  was irrigated.  The wound was closed with different layer of Vicryl and staples to the skin.  The patient is going to remain flat in bed for at least 48 hours.  In the preop, epic was unable to give me the diagnosis of lumbar CSF leak.          ______________________________ Hilda LiasErnesto Nasser Ku, M.D.     EB/MEDQ  D:  08/25/2016  T:  08/26/2016  Job:  [id number]

## 2016-08-26 NOTE — Progress Notes (Signed)
Week. He was recommended with is Patient ID: Adam GardenerRoger D Bradshaw, male   DOB: 1946-07-17, 70 y.o.   MRN: [id number]  6 (muscle flapleft pper extremity as thedoing  mited x , no headache, no weaknesswell, vomited x 1.

## 2016-08-27 MED ORDER — OXYCODONE HCL 5 MG PO TABS
10.0000 mg | ORAL_TABLET | ORAL | Status: DC | PRN
  Administered 2016-08-27 (×2): 10 mg via ORAL
  Filled 2016-08-27 (×3): qty 2

## 2016-08-27 NOTE — Progress Notes (Signed)
Patient ID: Adam GardenerRoger D Bradshaw, male   DOB: 1946/03/03, 70 y.o.   MRN: [id number] Resting, no headache. Wound dry. Continue flat till am.

## 2016-08-27 NOTE — Progress Notes (Signed)
Pt continues to complain of nausea. No emesis at this time. Pt feels nausea and headache related to morphine PCA and reluctant to use. Pt would like to discontinue morphine PCA and try a different analgesic.  Dr. Jeral FruitBotero notified and new orders obtained. Will discontinue morphine at this time. Lawson RadarHeather M Enola Siebers

## 2016-08-28 NOTE — Progress Notes (Signed)
Patient ID: Alex GardenerRoger D Bradshaw, male   DOB: 12/23/1945, 70 y.o.   MRN: [id number] Doing well    Spoke with wifr   Poss home over the wekwend

## 2016-08-28 NOTE — Care Management Note (Signed)
Case Management Note  Patient Details  Name: Adam GardenerRoger D Bradshaw MRN: [id number] Date of Birth: 13-Dec-1945  Subjective/Objective:    Pt underwent:   Exploration of lumbar wound.  Closure of durotomy. He is from home with his spouse.               Action/Plan: Pt currently on bedrest. Awaiting PT/OT recommendations. CM following for d/c needs.   Expected Discharge Date:                  Expected Discharge Plan:  Home/Self Care  In-House Referral:     Discharge planning Services     Post Acute Care Choice:    Choice offered to:     DME Arranged:    DME Agency:     HH Arranged:    HH Agency:     Status of Service:  In process, will continue to follow  If discussed at Long Length of Stay Meetings, dates discussed:    Additional Comments:  Kermit BaloKelli F Tyrica Afzal, RN 08/28/2016, 10:41 AM

## 2016-08-28 NOTE — Care Management Important Message (Signed)
Important Message  Patient Details  Name: Adam GardenerRoger D Karasik MRN: [id number] Date of Birth: 11-05-1946   Medicare Important Message Given:  Yes    Veronique Warga Stefan ChurchBratton 08/28/2016, 1:33 PM

## 2016-08-28 NOTE — Progress Notes (Signed)
Patient ID: Adam Bradshaw, male   DOB: 1946-07-29, 70 y.o.   MRN: [id number] Stable, no heaadche, no weakness. Wound dry. Hob . oob by 6 pm

## 2016-08-29 MED ORDER — DOCUSATE SODIUM 100 MG PO CAPS
100.0000 mg | ORAL_CAPSULE | Freq: Two times a day (BID) | ORAL | Status: DC
  Administered 2016-08-29 – 2016-08-30 (×2): 100 mg via ORAL
  Filled 2016-08-29 (×2): qty 1

## 2016-08-29 MED ORDER — SENNA 8.6 MG PO TABS
1.0000 | ORAL_TABLET | Freq: Every day | ORAL | Status: DC
  Administered 2016-08-29: 8.6 mg via ORAL
  Filled 2016-08-29: qty 1

## 2016-08-29 MED ORDER — INDOMETHACIN 25 MG PO CAPS
25.0000 mg | ORAL_CAPSULE | Freq: Three times a day (TID) | ORAL | Status: DC | PRN
  Administered 2016-08-29: 25 mg via ORAL
  Filled 2016-08-29 (×2): qty 1

## 2016-08-29 NOTE — Progress Notes (Signed)
Patient ID: Adam GardenerRoger D Bradshaw, male   DOB: 04/18/1946, 70 y.o.   MRN: [id number] Patient seems to bell still with a lot of back pain and leg spasms but no significant headache this morning did start mobilizing yesterday  Strength out of 5 wound clean dry and intact  Patient postop day 3 from repair CSF leak starting to mobilize still too much pain to discharge no evidence of CSF leak

## 2016-08-30 MED ORDER — INDOMETHACIN 50 MG PO CAPS: 100.0000 mg | ORAL_CAPSULE | Freq: Two times a day (BID) | ORAL | 0 refills | Status: DC

## 2016-08-30 MED ORDER — INDOMETHACIN 50 MG PO CAPS: 100.0000 mg | ORAL_CAPSULE | Freq: Two times a day (BID) | ORAL | 0 refills | Status: AC

## 2016-08-30 NOTE — Progress Notes (Signed)
CM called Reggie with [facility] to deliver RW to bedside.

## 2016-08-30 NOTE — Progress Notes (Signed)
Pt d/c to home by car with family. Assessment stable. Prescription given. All questions answered 

## 2016-08-30 NOTE — Progress Notes (Signed)
PT Cancellation Note  Patient Details Name: Alex GardenerRoger D Dolliver MRN: [id number] DOB: 26-Apr-1946   Cancelled Treatment:    Reason Eval/Treat Not Completed: Other (comment)   Patient's orders are for complete bedrest, although MD note indicates "pt starting to mobilize." Will seek clarification of orders and proceed as appropriate.   Ramona Ruark 08/30/2016, 7:48 AM Pager [phone]

## 2016-08-30 NOTE — Evaluation (Addendum)
Physical Therapy Evaluation Patient Details Name: Adam GardenerRoger D Schliep MRN: [id number] DOB: [birth year] Today's Date: 08/30/2016   History of Present Illness  Adam GardenerRoger D Chain is a 70 yo male who is s/p L4-L5 decompression 8/15. Returned for CSF leak/repair 9/20 with post-op bedrest lying flat until 9/23  PMH includes HTN and arthritis.      Clinical Impression  Patient is s/p above surgery resulting in the deficits listed below (see PT Problem List). Patient initially weak and unsteady. Improved with increased activity, however does need RW for support as he regains his strength. Patient will benefit from skilled PT to increase their independence and safety with mobility (while adhering to their precautions) to allow discharge to the venue listed below.    Follow Up Recommendations No PT follow up    Equipment Recommendations  Rolling walker with 5" wheels    Recommendations for Other Services       Precautions / Restrictions Precautions Precautions: Fall;Back Precaution Comments: patient able to recall 2 of 3 (not arching); reported he still has his handout from 8/16 Restrictions Weight Bearing Restrictions: No      Mobility  Bed Mobility Overal bed mobility: Needs Assistance Bed Mobility: Rolling;Sidelying to Sit Rolling: Min guard Sidelying to sit: Min guard       General bed mobility comments: +rail; vc for technique and near need for assist (incr effort/time)  Transfers Overall transfer level: Needs assistance Equipment used: Rolling walker (2 wheeled) Transfers: Sit to/from Stand Sit to Stand: Min guard         General transfer comment: x 3 various surfaces; vc for correct sequence with RW  Ambulation/Gait Ambulation/Gait assistance: Min assist Ambulation Distance (Feet): 90 Feet (12, 12, 130) Assistive device: Rolling walker (2 wheeled) Gait Pattern/deviations: Step-through pattern;Decreased stride length   Gait velocity interpretation: Below normal speed for  age/gender General Gait Details: pt with minor unsteadiness with intial standing due to gross weakness from prolonged bedrest; denied dizziness; legs visibly shaking initially and progressively improved  Stairs Stairs: Yes Stairs assistance: Min assist Stair Management: One rail Left;Forwards Number of Stairs: 10 General stair comments: intial 2 steps with posterior lean and support to maintain balance; progressively improved to minguard assist for safety  Wheelchair Mobility    Modified Rankin (Stroke Patients Only)       Balance Overall balance assessment: Needs assistance         Standing balance support: Single extremity supported Standing balance-Leahy Scale: Poor Standing balance comment: unsteady with intial transistions                             Pertinent Vitals/Pain Pain Assessment: 0-10 Pain Score: 0-No pain    Home Living Family/patient expects to be discharged to:: Private residence Living Arrangements: Spouse/significant other;Children Available Help at Discharge: Family;Available 24 hours/day Type of Home: Mobile home Home Access: Stairs to enter Entrance Stairs-Rails: Left Entrance Stairs-Number of Steps: 4 Home Layout: One level Home Equipment: Cane - single point Additional Comments: Pt lives at home with his wife and 2 adult children    Prior Function Level of Independence: Independent               Hand Dominance   Dominant Hand: Right    Extremity/Trunk Assessment   Upper Extremity Assessment: Overall WFL for tasks assessed           Lower Extremity Assessment: Generalized weakness      Cervical / Trunk Assessment:  Other exceptions (s/p lumbar surgery)  Communication   Communication: HOH  Cognition Arousal/Alertness: Awake/alert Behavior During Therapy: WFL for tasks assessed/performed Overall Cognitive Status: Within Functional Limits for tasks assessed                      General Comments       Exercises     Assessment/Plan    PT Assessment Patient needs continued PT services  PT Problem List Decreased strength;Decreased balance;Decreased mobility;Decreased knowledge of use of DME;Decreased knowledge of precautions          PT Treatment Interventions DME instruction;Gait training;Stair training;Functional mobility training;Therapeutic activities;Balance training;Neuromuscular re-education;Patient/family education    PT Goals (Current goals can be found in the Care Plan section)  Acute Rehab PT Goals Patient Stated Goal: To return back home PT Goal Formulation: With patient Time For Goal Achievement: 09/04/16 Potential to Achieve Goals: Good    Frequency Min 5X/week   Barriers to discharge        Co-evaluation               End of Session Equipment Utilized During Treatment: Gait belt Activity Tolerance: Patient tolerated treatment well Patient left: in chair;with call bell/phone within reach;with chair alarm set Nurse Communication: Mobility status (ok for d/c home with assist)         Time: 4540-9811 PT Time Calculation (min) (ACUTE ONLY): 29 min   Charges:   PT Evaluation $PT Eval Low Complexity: 1 Procedure PT Treatments $Gait Training: 8-22 mins   PT G Codes:        Jenavee Laguardia Sep 17, 2016, 10:25 AM Pager [phone]

## 2016-08-30 NOTE — Discharge Instructions (Addendum)
Call if your wound is draining fluid.  Call if your temperature is greater than 101.5 Call if your wound is red, tender, is draining purulent fluid

## 2016-08-30 NOTE — Discharge Summary (Signed)
Physician Discharge Summary  Patient ID: Adam GardenerRoger D Bradshaw MRN: [id number] DOB/AGE: [birth year] 72 y.o.  Admit date: 08/25/2016 Discharge date: 08/30/2016  Admission Diagnoses:CSF leak   Discharge Diagnoses: Csf leak Active Problems:   Lumbar radiculopathy   Discharged Condition: good  Hospital Course: [name] was admitted and taken to the operating room for primary repair of a postsurgical csf leak. He has done well post op, his wound is clean, flat, and without spinal fluid on skin. He is moving all extremities well.   Treatments: surgery: Exploration of lumbar wound.  Closure of durotomy. Microscope.   Discharge Exam: Blood pressure 128/78, pulse 99, temperature 98.8 F (37.1 C), temperature source Oral, resp. rate 18, height 5\' 9"  (1.753 m), weight 94.3 kg (208 lb), SpO2 94 %. General appearance: alert, cooperative, appears stated age and no distress Neurologic: Alert and oriented X 3, normal strength and tone. Normal symmetric reflexes. Normal coordination and gait  Disposition: 01-Home or Self Care Cerebrospinal fluid leak, spinal fluid collection    Medication List    TAKE these medications   aspirin EC 81 MG tablet Take 81 mg by mouth daily.   aspirin-acetaminophen-caffeine 250-250-65 MG tablet Commonly known as:  EXCEDRIN MIGRAINE Take 2 tablets by mouth every 6 (six) hours as needed for headache.   gabapentin 300 MG capsule Commonly known as:  NEURONTIN Take 300 mg by mouth 3 (three) times daily.   HYDROcodone-acetaminophen 10-325 MG tablet Commonly known as:  NORCO Take 1 tablet by mouth every 4 (four) hours as needed for severe pain.   HYDROmorphone 4 MG tablet Commonly known as:  DILAUDID Take 4 mg by mouth every 6 (six) hours as needed for moderate pain.   losartan 100 MG tablet Commonly known as:  COZAAR Take 100 mg by mouth daily.   METAMUCIL FREE & NATURAL 43 % Powd Generic drug:  Psyllium Take 1 packet by mouth 2 (two) times daily.    multivitamin with minerals tablet Take 1 tablet by mouth daily.   rosuvastatin 10 MG tablet Commonly known as:  CRESTOR Take 10 mg by mouth daily.   tamsulosin 0.4 MG Caps capsule Commonly known as:  FLOMAX Take 0.4 mg by mouth daily.        Signed: Ariyon Gerstenberger L 08/30/2016, 10:51 AM

## 2016-09-10 NOTE — Progress Notes (Signed)
Late entry for missed gcode    07/22/16 0955  OT Time Calculation  OT Start Time (ACUTE ONLY) 0943  OT Stop Time (ACUTE ONLY) 0953  OT Time Calculation (min) 10 min  OT G-codes **NOT FOR INPATIENT CLASS**  Functional Assessment Tool Used Clinical judgement  Functional Limitation Self care  Self Care Current Status (Z6109(G8987) CI  Self Care Goal Status (U0454(G8988) CI  Self Care Discharge Status (U9811(G8989) CI  OT General Charges  $OT Visit 1 Procedure  OT Evaluation  $OT Eval Moderate Complexity 1 Procedure   Hurshel PartyBailey Tyran Huser M.S., OTR/L Pager: [phone]

## 2016-09-16 NOTE — Progress Notes (Signed)
Physical Therapy Note  These are the G-codes for evaluation on 8/16.    07/22/16 1344  PT G-Codes **NOT FOR INPATIENT CLASS**  Functional Assessment Tool Used clinical judgement  Functional Limitation Mobility: Walking and moving around  Mobility: Walking and Moving Around Current Status (Z6109(G8978) CI  Mobility: Walking and Moving Around Goal Status (U0454(G8979) CI  Mobility: Walking and Moving Around Discharge Status (U9811(G8980) CI    Lewis ShockAshly Melaine Mcphee, PT, DPT Pager #: [phone] Office #: [phone]

## 2017-02-04 ENCOUNTER — Ambulatory Visit (INDEPENDENT_AMBULATORY_CARE_PROVIDER_SITE_OTHER): Payer: Medicare Other | Admitting: Neurology

## 2017-02-04 ENCOUNTER — Other Ambulatory Visit: Payer: Medicare Other

## 2017-02-04 DIAGNOSIS — R404 Transient alteration of awareness: Secondary | ICD-10-CM

## 2017-02-04 DIAGNOSIS — R4182 Altered mental status, unspecified: Secondary | ICD-10-CM

## 2017-02-04 NOTE — Procedures (Signed)
     History: Adam Bradshaw is a 71 year old gentleman with a 4 year history of intermittent episodes of altered mental status that is preceded by headache. The patient has a history of a memory disorder as well. The patient is being evaluated for the episodes of mental status change.  This is a routine EEG. No skull defects are noted. Medications include vitamin D, hydrocodone, meloxicam, and triamcinolone ointment.  EEG classification: Dysrhythmia grade 2 left temporal  Description of the recording: The background rhythms of this recording consists of a fairly well modulated medium amplitude alpha rhythm of 8 Hz that is reactive to opening and closure. As the record progresses, there appears to be mild theta slowing emanating from the left temporal area, intermittent sharp transients are seen during the recording. As the record progresses, the patient appears to enter the drowsy state with generalized mild slowing seen, and appears to enter early stage II sleep with rudimentary sleep spindles. Photic stimulation is performed, this results in a bilateral and symmetric photic driving response. Hyperventilation is also performed with minimal buildup of the background rhythm activities without significant slowing seen. EKG monitor shows no evidence of cardiac rhythm abnormalities with a heart rate of 60.  Impression: This is an abnormal EEG recording secondary to evidence of slowing and sharp transients emanating from the left temporal area suggesting a left brain abnormality associated with irritability and a lowered seizure threshold. No electrographic seizures were recorded, however.

## 2017-04-16 IMAGING — CR DG LUMBAR SPINE 2-3V
2 series · 2 of 2 positions shown · non-contrast
Comparison: None.

CLINICAL DATA: L4-5 laminectomy

EXAM:
LUMBAR SPINE - 2-3 VIEW

[lat (1 of 2)]
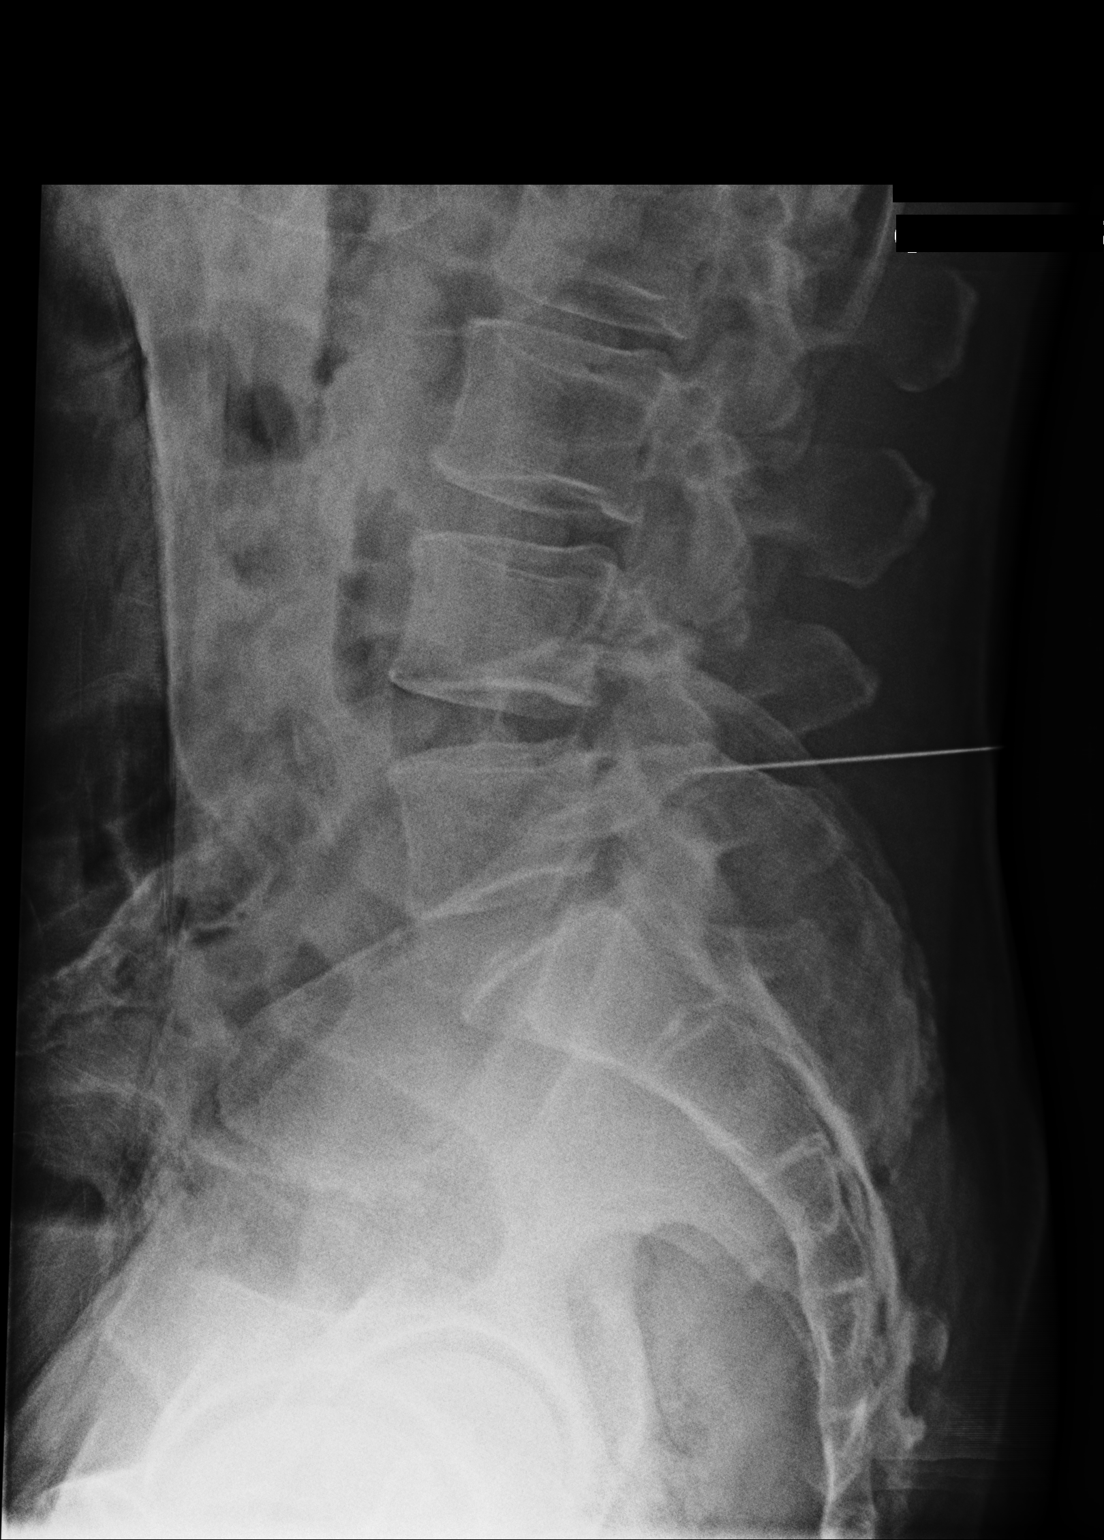

[lat (2 of 2)]
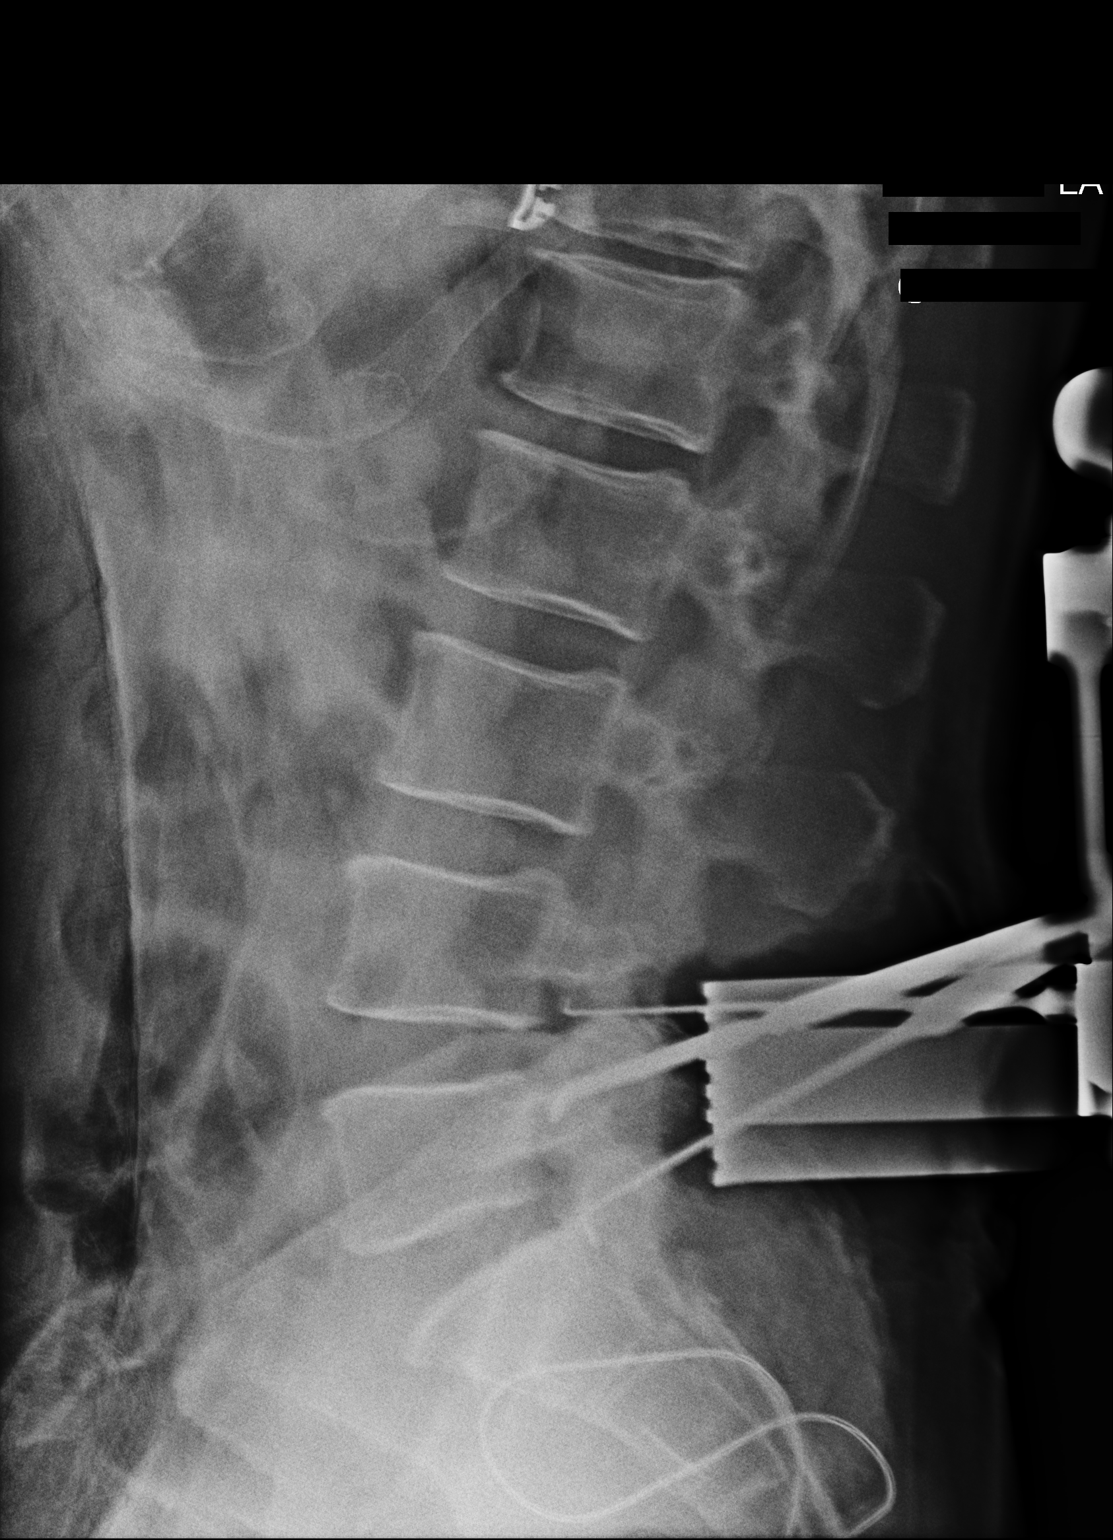

[2 of 2 positions shown; findings below may reference images not displayed]

FINDINGS: A surgical marker projects posteriorly at L5.
IMPRESSION: A surgical marker projects posteriorly at L5.

## 2019-01-23 ENCOUNTER — Other Ambulatory Visit: Payer: Self-pay | Admitting: Neurological Surgery

## 2019-01-23 DIAGNOSIS — M5416 Radiculopathy, lumbar region: Secondary | ICD-10-CM

## 2019-01-29 ENCOUNTER — Ambulatory Visit
Admission: RE | Admit: 2019-01-29 | Discharge: 2019-01-29 | Disposition: A | Discharge status: Home / Self Care | Payer: Medicare Other | Source: Ambulatory Visit | Attending: Neurological Surgery | Admitting: Neurological Surgery

## 2019-01-29 DIAGNOSIS — M5416 Radiculopathy, lumbar region: Secondary | ICD-10-CM

## 2019-01-29 MED ORDER — GADOBENATE DIMEGLUMINE 529 MG/ML IV SOLN
19.0000 mL | Freq: Once | INTRAVENOUS | Status: AC | PRN
  Administered 2019-01-29: 19 mL via INTRAVENOUS

## 2019-05-24 ENCOUNTER — Ambulatory Visit: Payer: Medicare Other | Attending: Neurology

## 2019-05-24 DIAGNOSIS — G4733 Obstructive sleep apnea (adult) (pediatric): Secondary | ICD-10-CM | POA: Insufficient documentation

## 2019-05-26 ENCOUNTER — Other Ambulatory Visit: Payer: Self-pay

## 2020-03-05 ENCOUNTER — Other Ambulatory Visit: Payer: Self-pay

## 2020-03-05 ENCOUNTER — Other Ambulatory Visit
Admission: RE | Admit: 2020-03-05 | Discharge: 2020-03-05 | Disposition: A | Discharge status: Home / Self Care | Payer: Medicare Other | Source: Ambulatory Visit | Attending: Neurology | Admitting: Neurology

## 2020-03-05 DIAGNOSIS — Z20822 Contact with and (suspected) exposure to covid-19: Secondary | ICD-10-CM | POA: Diagnosis not present

## 2020-03-05 DIAGNOSIS — Z01812 Encounter for preprocedural laboratory examination: Secondary | ICD-10-CM | POA: Insufficient documentation

## 2020-03-05 LAB — SARS CORONAVIRUS 2 (TAT 6-24 HRS): SARS Coronavirus 2: NEGATIVE

## 2020-03-07 ENCOUNTER — Ambulatory Visit: Payer: Medicare Other | Attending: Internal Medicine

## 2020-03-07 DIAGNOSIS — G4733 Obstructive sleep apnea (adult) (pediatric): Secondary | ICD-10-CM | POA: Diagnosis present

## 2020-03-08 ENCOUNTER — Other Ambulatory Visit: Payer: Self-pay

## 2020-09-10 ENCOUNTER — Other Ambulatory Visit: Payer: Self-pay

## 2020-09-10 ENCOUNTER — Encounter: Payer: Self-pay | Admitting: Physical Therapy

## 2020-09-10 ENCOUNTER — Ambulatory Visit: Payer: Medicare Other | Attending: Neurology | Admitting: Physical Therapy

## 2020-09-10 VITALS — BP 104/59 | HR 74

## 2020-09-10 DIAGNOSIS — Z9181 History of falling: Secondary | ICD-10-CM | POA: Diagnosis present

## 2020-09-10 DIAGNOSIS — R2681 Unsteadiness on feet: Secondary | ICD-10-CM

## 2020-09-10 NOTE — Therapy (Signed)
[facility] MAIN [facility] SERVICES [address] [place], [place], [address] Phone: [phone]   Fax:  [phone]  Physical Therapy Evaluation  Patient Details  Name: Adam Bradshaw MRN: [id number] Date of Birth: 02-27-1946 Referring Provider (PT): Dr. Sherryll Burger   Encounter Date: 09/10/2020   PT End of Session - 09/10/20 1012    Visit Number 1    Number of Visits 13    Date for PT Re-Evaluation 10/22/20    Authorization Type medicare    PT Start Time 0902    PT Stop Time 1000    PT Time Calculation (min) 58 min    Equipment Utilized During Treatment Gait belt    Activity Tolerance Patient tolerated treatment well    Behavior During Therapy [facility] for tasks assessed/performed           Past Medical History:  Diagnosis Date  . Arthritis   . Headache   . Hypertension   . Pre-diabetes     Past Surgical History:  Procedure Laterality Date  . APPENDECTOMY    . basal skin surgery x2    . CERVICAL DISC SURGERY    . HAND SURGERY Right   . LUMBAR LAMINECTOMY/DECOMPRESSION MICRODISCECTOMY N/A 07/21/2016   Procedure: Lumbar four-five Laminectomy;  Surgeon: Hilda Lias, MD;  Location: [facility];  Service: Neurosurgery;  Laterality: N/A;  . LUMBAR WOUND DEBRIDEMENT N/A 08/25/2016   Procedure: Exploration of lumbar wound;  Surgeon: Hilda Lias, MD;  Location: [facility]UR[facility] OR[facility];  Service: Neurosurgery;  Laterality: N/A;  Exploration of lumbar wound  . SHOULDER SURGERY Right     Vitals:   09/10/20 0904 09/10/20 0925  BP: 124/62 (!) 104/59  Pulse: (!) 56 74      Subjective Assessment - 09/10/20 0904    Subjective "I stagger like a drunk man and I don't drink."    Pertinent History 74 yo Male reports trouble with his balance over last year or so. He reports approximately 3-4 falls in last 6 months. He reports most falls occur when he trips over something or mis steps. He denies any numbness/tingling. He is not currently using a RW at this time. He  reports using SPC sometimes especially when walking outside of home. He rarely uses assistive device inside home. He reports occasional dizziness/lightheadedness especially when getting up from chair. He denies any falls associated with dizziness. He does have a PMH significant for chronic low back pain. He reports pain is worse in the morning. He does take pain medicine at night and when he gets up. He is unsure if back pain affects his balance. He also has a PMH significant for impaired memory/cognition.    Limitations Standing;Walking    How long can you sit comfortably? NA, will have stiffness when he gets up after sitting for a while    How long can you stand comfortably? 10-15 min, limited by back pain and fatigue    How long can you walk comfortably? about 50 yards    Patient Stated Goals "get my balance back."    Currently in Pain? Yes    Pain Score 3     Pain Location Back    Pain Orientation Lower    Pain Descriptors / Indicators Aching;Sore    Pain Type Chronic pain    Pain Onset More than a month ago    Pain Frequency Intermittent    Aggravating Factors  worse in the morning, lot of bending/lifting    Pain Relieving  Factors taking pain medication,    Effect of Pain on Daily Activities decreased activity tolerance;    Multiple Pain Sites No              [facility] PT Assessment - 09/10/20 0001      Assessment   Medical Diagnosis imbalance    Referring Provider (PT) Dr. Sherryll BurgerShah    Onset Date/Surgical Date --   last year or so   Hand Dominance Right    Next MD Visit unsure    Prior Therapy denies any PT for this condition;       Precautions   Precautions Fall      Restrictions   Weight Bearing Restrictions No      Balance Screen   Has the patient fallen in the past 6 months Yes    How many times? 3-4    Has the patient had a decrease in activity level because of a fear of falling?  Yes    Is the patient reluctant to leave their home because of a fear of falling?  No        Home Nurse, mental healthnvironment   Living Environment Private residence    Living Arrangements Spouse/significant other    Available Help at Discharge Family    Type of Home House    Home Access Stairs to enter   unsteady on steps   Entrance Stairs-Number of Steps 4    Entrance Stairs-Rails Left    Home Layout One level    Home Equipment Walker - 2 wheels;Cane - single point;Shower seat      Prior Function   Level of Independence Independent with basic ADLs   wife assists with cooking/cleaning   Vocation Retired    Leisure going to [employer] playing cornhole/bocci ball      Cognition   Overall Cognitive Status History of cognitive impairments - at baseline    Memory Impaired    Memory Impairment Decreased short term memory   goes into room and forgets why he went in there     Observation/Other Assessments   Focus on Therapeutic Outcomes (FOTO)  53%      Sensation   Light Touch Appears Intact    Proprioception Appears Intact      Coordination   Gross Motor Movements are Fluid and Coordinated Yes    Fine Motor Movements are Fluid and Coordinated Yes    Finger Nose Finger Test accurate bilaterally    Heel Shin Test accurate bilaterally      Posture/Postural Control   Posture Comments WFL      ROM / Strength   AROM / PROM / Strength AROM;Strength      AROM   Overall AROM Comments WFL      Strength   Overall Strength Comments WFL      Transfers   Comments able to transfer sit<>Stand without pushing on chair with good control      Ambulation/Gait   Gait Comments Pt ambulates without AD, reciprocal gait pattern, minimal imbalance when walking forward, does veer side/side with distraction, head turns       Standardized Balance Assessment   Standardized Balance Assessment 10 meter walk test;Five Times Sit to Stand;Timed Up and Go Test    Five times sit to stand comments  14.95 with arms across chest, low fall risk    10 Meter Walk 1.25 m/s without AD      Timed Up and Go Test    Normal TUG (seconds) 8.08  Cognitive TUG (seconds) --   attempted, patient unable to complete cognitive challenge     High Level Balance   High Level Balance Activites Other (comment)    High Level Balance Comments Marching with eyes closed, veers to right with instability noted;   SLS: loss of balance after 2-3 sec, tandem stance: 2-3 sec     Functional Gait  Assessment   Gait assessed  Yes    Gait Level Surface Walks 20 ft in less than 5.5 sec, no assistive devices, good speed, no evidence for imbalance, normal gait pattern, deviates no more than 6 in outside of the 12 in walkway width.    Change in Gait Speed Able to change speed, demonstrates mild gait deviations, deviates 6-10 in outside of the 12 in walkway width, or no gait deviations, unable to achieve a major change in velocity, or uses a change in velocity, or uses an assistive device.    Gait with Horizontal Head Turns Performs head turns smoothly with slight change in gait velocity (eg, minor disruption to smooth gait path), deviates 6-10 in outside 12 in walkway width, or uses an assistive device.    Gait with Vertical Head Turns Performs task with slight change in gait velocity (eg, minor disruption to smooth gait path), deviates 6 - 10 in outside 12 in walkway width or uses assistive device    Gait and Pivot Turn Pivot turns safely within 3 sec and stops quickly with no loss of balance.    Step Over Obstacle Is able to step over one shoe box (4.5 in total height) without changing gait speed. No evidence of imbalance.    Gait with Narrow Base of Support Ambulates 4-7 steps.    Gait with Eyes Closed Walks 20 ft, slow speed, abnormal gait pattern, evidence for imbalance, deviates 10-15 in outside 12 in walkway width. Requires more than 9 sec to ambulate 20 ft.    Ambulating Backwards Walks 20 ft, uses assistive device, slower speed, mild gait deviations, deviates 6-10 in outside 12 in walkway width.    Steps Alternating feet, must  use rail.    Total Score 20    FGA comment: <23 indicates increased risk for falls                      Objective measurements completed on examination: See above findings.   TREATMENT:  PT initiated HEP Single leg heel raises with rail assist for balance x10 reps each with mod VCs for proper positioning including to avoid excessive UE weight bearing and reduce trunk leaning  SLS on firm surface 3-5 sec x2 reps each LE with 1-0 rail assist  Tandem stance on firm surface 10 sec hold x1 rep each foot in front with 1-0 rail assist Patient required min VCs for balance stability, including to increase trunk control for less loss of balance with smaller base of support  Provided written HEP for better adherence;             PT Education - 09/10/20 1012    Education Details plan of care, HEP    Person(s) Educated Patient    Methods Explanation;Verbal cues;Handout    Comprehension Verbalized understanding;Returned demonstration;Verbal cues required;Need further instruction            PT Short Term Goals - 09/10/20 1019      PT SHORT TERM GOAL #1   Title Patient will be adherent to HEP at least 3x a week to improve  functional strength and balance for better safety at home.    Time 4    Period Weeks    Status New    Target Date 10/08/20      PT SHORT TERM GOAL #2   Title Patient will improve balance as evidenced by being able to hold SLS for >5 sec without loss of balance to exhibit improved stance control and reduce fall risk.    Time 4    Period Weeks    Status New    Target Date 10/08/20             PT Long Term Goals - 09/10/20 1020      PT LONG TERM GOAL #1   Title Patient will increase Functional Gait Assessment score to >22/30 as to reduce fall risk and improve dynamic gait safety with community ambulation.    Time 6    Period Weeks    Status New    Target Date 10/22/20      PT LONG TERM GOAL #2   Title Patient will improve FOTO score to  >60% to indicate improved functional mobility    Time 6    Period Weeks    Status New    Target Date 10/22/20      PT LONG TERM GOAL #3   Title Patient will be independent in walking on unlevel surfaces including ramp/curb negotiation without AD to improve safety in community.    Time 6    Period Weeks    Status New    Target Date 10/22/20                  Plan - 09/10/20 1013    Clinical Impression Statement 74 yo Male presents to therapy with complaints of imbalance. He reports feeling like he staggers and veers a lot when walking. He presents to therapy without AD. Patient reports approximately 3-4 falls in last 6 months most occuring when tripping over things. He does report dizziness with positional change and tested positive for possible orthostatic hypotension, see vitals. He exhibits functional strength in BUE/BLE and good coordination. Patient does have some memory deficits which are baseline. He denies any numbness/tingling. He did test as high fall risk as evidenced with functional gait assessment. He exhibits increased stagger/veering when walking with head turns/distractions. However typical forward walking looked normal with normal gait speed. He would benefit from skilled PT intervention to address balance deficits and reduce fall risk.    Personal Factors and Comorbidities Age;Comorbidity 3+;Time since onset of injury/illness/exacerbation    Comorbidities HTN, orthostatic hypotension, memory deficits, sleep apnea, high fall risk, arthritis with chronic low back pain;    Examination-Activity Limitations Carry;Bend;Stairs;Stand    Examination-Participation Restrictions Church;Cleaning;Community Activity;Shop;Volunteer;Yard Work    Conservation officer, historic buildings Stable/Uncomplicated    Optometrist Low    Rehab Potential Good    PT Frequency 2x / week    PT Duration 6 weeks    PT Treatment/Interventions Cryotherapy;Moist Heat;Gait training;Stair  training;Functional mobility training;Therapeutic activities;Therapeutic exercise;Balance training;Neuromuscular re-education;Patient/family education;Energy conservation    PT Next Visit Plan address balance deficits    PT Home Exercise Plan initiated, will continue to address    Recommended Other Services none    Consulted and Agree with Plan of Care Patient           Patient will benefit from skilled therapeutic intervention in order to improve the following deficits and impairments:  Abnormal gait, Decreased balance, Decreased endurance, Decreased mobility, Difficulty walking, Decreased  safety awareness, Decreased activity tolerance  Visit Diagnosis: Unsteadiness on feet  History of falling     Problem List Patient Active Problem List   Diagnosis Date Noted  . Lumbar radiculopathy 08/25/2016  . 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

## 2020-09-10 NOTE — Patient Instructions (Signed)
Access Code: [email or web address] URL: [email or web address] Date: 09/10/2020 Prepared by: Zettie Pho  Exercises Single Leg Heel Raise with Counter Support - 1 x daily - 7 x weekly - 1 sets - 10 reps Standing Tandem Balance with Counter Support - 1 x daily - 7 x weekly - 1 sets - 5 reps - 5-10 sec hold Standing Single Leg Stance with Counter Support - 1 x daily - 7 x weekly - 1 sets - 5 reps - 5 sec hold

## 2020-09-12 ENCOUNTER — Ambulatory Visit: Payer: Medicare Other | Admitting: Physical Therapy

## 2020-09-12 ENCOUNTER — Other Ambulatory Visit: Payer: Self-pay

## 2020-09-12 ENCOUNTER — Encounter: Payer: Self-pay | Admitting: Physical Therapy

## 2020-09-12 DIAGNOSIS — R2681 Unsteadiness on feet: Secondary | ICD-10-CM

## 2020-09-12 DIAGNOSIS — Z9181 History of falling: Secondary | ICD-10-CM

## 2020-09-12 NOTE — Therapy (Signed)
[place] [facility] MAIN [facility] SERVICES [address] [place], [place], [address] Phone: [phone]   Fax:  [phone]  Physical Therapy Treatment  Patient Details  Name: Adam Bradshaw MRN: [id number] Date of Birth: [birth year] Referring Provider (PT): Dr. Sherryll Burger   Encounter Date: 09/12/2020   PT End of Session - 09/12/20 1019    Visit Number 2    Number of Visits 13    Date for PT Re-Evaluation 10/22/20    Authorization Type medicare    PT Start Time 1015    PT Stop Time 1100    PT Time Calculation (min) 45 min    Equipment Utilized During Treatment Gait belt    Activity Tolerance Patient tolerated treatment well    Behavior During Therapy [facility] for tasks assessed/performed           Past Medical History:  Diagnosis Date  . Arthritis   . Headache   . Hypertension   . Pre-diabetes     Past Surgical History:  Procedure Laterality Date  . APPENDECTOMY    . basal skin surgery x2    . CERVICAL DISC SURGERY    . HAND SURGERY Right   . LUMBAR LAMINECTOMY/DECOMPRESSION MICRODISCECTOMY N/A 07/21/2016   Procedure: Lumbar four-five Laminectomy;  Surgeon: Hilda Lias, MD;  Location: [facility];  Service: Neurosurgery;  Laterality: N/A;  . LUMBAR WOUND DEBRIDEMENT N/A 08/25/2016   Procedure: Exploration of lumbar wound;  Surgeon: Hilda Lias, MD;  Location: [facility];  Service: Neurosurgery;  Laterality: N/A;  Exploration of lumbar wound  . SHOULDER SURGERY Right     There were no vitals filed for this visit.   Subjective Assessment - 09/12/20 1018    Subjective Patient reports doing well. Reports walking some this morning waiting on his son to get out of the hospital; Reports working on exercises at home with some difficulty;    Pertinent History 74 yo Male reports trouble with his balance over last year or so. He reports approximately 3-4 falls in last 6 months. He reports most falls occur when he trips over something or mis steps. He  denies any numbness/tingling. He is not currently using a RW at this time. He reports using SPC sometimes especially when walking outside of home. He rarely uses assistive device inside home. He reports occasional dizziness/lightheadedness especially when getting up from chair. He denies any falls associated with dizziness. He does have a PMH significant for chronic low back pain. He reports pain is worse in the morning. He does take pain medicine at night and when he gets up. He is unsure if back pain affects his balance. He also has a PMH significant for impaired memory/cognition.    Limitations Standing;Walking    How long can you sit comfortably? NA, will have stiffness when he gets up after sitting for a while    How long can you stand comfortably? 10-15 min, limited by back pain and fatigue    How long can you walk comfortably? about 50 yards    Patient Stated Goals "get my balance back."    Currently in Pain? No/denies    Pain Onset More than a month ago               Patient instructed in advanced balance exercise  Standing beside bar:   Standing on airex foam: -feet together: eyes open 30 sec hold x2 reps, eyes closed 10 sec hold x2 reps;  -alternate toe taps to 4  inch step with 0 rail assist x15 reps bilaterally with min A for safety; -Standing one foot on airex, one foot on 8 inch step,  Unsupported standing 15 sec hold x1 rep each foot on step;    BUE ball pass side/side x5 reps each foot on step -Heel raises x15 reps, unsupported with cues for knee extension for better ankle strengthening;  -standing one foot on airex, one foot on soccer ball, small circles clockwise/counterclockwise x10 reps each foot on ball with min A for safety;  Patient required min VCs for balance stability, including to increase trunk control for less loss of balance with smaller base of support   Instructed patient in dynamic balance exercise: Resisted walking, 12.5# forward/backward, side/side x4  way, x3 laps each direction; required min A for safety and cues to improve weight shift especially with eccentric control for better balance control.  Weaving around cones #5 x6 laps with CGA-supervision for safety; required min VCs to increase step length and increase turn for better cone negotiation Side stepping over cones #5 x2 lap each direction with min A for safety and mod VCs to increase step length and increase hip flexion for better foot clearance   Patient tolerated session well. He did require min A with advanced balance exercise particularly when standing on foam without rail assist or when side stepping over cones. He also required min VCS for weight shift and to increase step length for better balance when standing and negotiating obstacles. Denies any pain or fatigue at end of session;                     PT Education - 09/12/20 1019    Education Details balance/HEP    Person(s) Educated Patient    Methods Explanation;Verbal cues    Comprehension Verbalized understanding;Returned demonstration;Verbal cues required;Need further instruction            PT Short Term Goals - 09/10/20 1019      PT SHORT TERM GOAL #1   Title Patient will be adherent to HEP at least 3x a week to improve functional strength and balance for better safety at home.    Time 4    Period Weeks    Status New    Target Date 10/08/20      PT SHORT TERM GOAL #2   Title Patient will improve balance as evidenced by being able to hold SLS for >5 sec without loss of balance to exhibit improved stance control and reduce fall risk.    Time 4    Period Weeks    Status New    Target Date 10/08/20             PT Long Term Goals - 09/10/20 1020      PT LONG TERM GOAL #1   Title Patient will increase Functional Gait Assessment score to >22/30 as to reduce fall risk and improve dynamic gait safety with community ambulation.    Time 6    Period Weeks    Status New    Target Date  10/22/20      PT LONG TERM GOAL #2   Title Patient will improve FOTO score to >60% to indicate improved functional mobility    Time 6    Period Weeks    Status New    Target Date 10/22/20      PT LONG TERM GOAL #3   Title Patient will be independent in walking on unlevel surfaces including ramp/curb  negotiation without AD to improve safety in community.    Time 6    Period Weeks    Status New    Target Date 10/22/20                 Plan - 09/12/20 1103    Clinical Impression Statement Patient motivated and participated well within session. He was instructed in advanced balance exercise. He was able to exhibit good static standing balance but did have some instability with eyes closed. Patient does require min a when standing on compliant surface especially with reduced rail assist. Patient also required cues to increase step length when stepping over obstacles. He would benefit from additional skilled PT Intervention to improve strength, balance and mobility;    Personal Factors and Comorbidities Age;Comorbidity 3+;Time since onset of injury/illness/exacerbation    Comorbidities HTN, orthostatic hypotension, memory deficits, sleep apnea, high fall risk, arthritis with chronic low back pain;    Examination-Activity Limitations Carry;Bend;Stairs;Stand    Examination-Participation Restrictions Church;Cleaning;Community Activity;Shop;Volunteer;Yard Work    Stability/Clinical Decision Making Stable/Uncomplicated    Rehab Potential Good    PT Frequency 2x / week    PT Duration 6 weeks    PT Treatment/Interventions Cryotherapy;Moist Heat;Gait training;Stair training;Functional mobility training;Therapeutic activities;Therapeutic exercise;Balance training;Neuromuscular re-education;Patient/family education;Energy conservation    PT Next Visit Plan address balance deficits    PT Home Exercise Plan initiated, will continue to address    Consulted and Agree with Plan of Care Patient            Patient will benefit from skilled therapeutic intervention in order to improve the following deficits and impairments:  Abnormal gait, Decreased balance, Decreased endurance, Decreased mobility, Difficulty walking, Decreased safety awareness, Decreased activity tolerance  Visit Diagnosis: Unsteadiness on feet  History of falling     Problem List Patient Active Problem List   Diagnosis Date Noted  . Lumbar radiculopathy 08/25/2016  . 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

## 2020-09-16 ENCOUNTER — Ambulatory Visit: Payer: Medicare Other | Admitting: Physical Therapy

## 2020-09-16 ENCOUNTER — Other Ambulatory Visit: Payer: Self-pay

## 2020-09-16 ENCOUNTER — Ambulatory Visit: Payer: Medicare Other

## 2020-09-16 DIAGNOSIS — R2681 Unsteadiness on feet: Secondary | ICD-10-CM | POA: Diagnosis not present

## 2020-09-16 DIAGNOSIS — Z9181 History of falling: Secondary | ICD-10-CM

## 2020-09-16 NOTE — Therapy (Signed)
[place] [facility] MAIN [facility] SERVICES [address] [place], [place], [address] Phone: [phone]   Fax:  [phone]  Physical Therapy Treatment  Patient Details  Name: Adam Bradshaw MRN: [id number] Date of Birth: June 11, 1946 Referring Provider (PT): Dr. Sherryll Burger   Encounter Date: 09/16/2020   PT End of Session - 09/16/20 1303    Visit Number 3    Number of Visits 13    Date for PT Re-Evaluation 10/22/20    Authorization Type medicare    PT Start Time 1300    PT Stop Time 1344    PT Time Calculation (min) 44 min    Equipment Utilized During Treatment Gait belt    Activity Tolerance Patient tolerated treatment well    Behavior During Therapy WFL for tasks assessed/performed           Past Medical History:  Diagnosis Date   Arthritis    Headache    Hypertension    Pre-diabetes     Past Surgical History:  Procedure Laterality Date   APPENDECTOMY     basal skin surgery x2     CERVICAL DISC SURGERY     HAND SURGERY Right    LUMBAR LAMINECTOMY/DECOMPRESSION MICRODISCECTOMY N/A 07/21/2016   Procedure: Lumbar four-five Laminectomy;  Surgeon: Hilda Lias, MD;  Location: [facility];  Service: Neurosurgery;  Laterality: N/A;   LUMBAR WOUND DEBRIDEMENT N/A 08/25/2016   Procedure: Exploration of lumbar wound;  Surgeon: Hilda Lias, MD;  Location: [facility];  Service: Neurosurgery;  Laterality: N/A;  Exploration of lumbar wound   SHOULDER SURGERY Right     There were no vitals filed for this visit.   Subjective Assessment - 09/16/20 1300    Subjective Patient notes compliance with HEP, stating that single leg stance continues to be difficult. Denies falls or LOB since last session, but notes that he continues to "walk zig zag-like"    Pertinent History 74 yo Male reports trouble with his balance over last year or so. He reports approximately 3-4 falls in last 6 months. He reports most falls occur when he trips over something or  mis steps. He denies any numbness/tingling. He is not currently using a RW at this time. He reports using SPC sometimes especially when walking outside of home. He rarely uses assistive device inside home. He reports occasional dizziness/lightheadedness especially when getting up from chair. He denies any falls associated with dizziness. He does have a PMH significant for chronic low back pain. He reports pain is worse in the morning. He does take pain medicine at night and when he gets up. He is unsure if back pain affects his balance. He also has a PMH significant for impaired memory/cognition.    Limitations Standing;Walking    How long can you sit comfortably? NA, will have stiffness when he gets up after sitting for a while    How long can you stand comfortably? 10-15 min, limited by back pain and fatigue    How long can you walk comfortably? about 50 yards    Patient Stated Goals "get my balance back."    Currently in Pain? No/denies    Pain Onset More than a month ago           Treatment:  NuStep level 4, 4 minutes for BLE and BUE coordination. RPM >60 for cardiovascular challenge  Agility ladder: forward both feet in each square 4x, side stepping in squares 4x, side stepping into/out of squares 4x, forward diagonals  4x. Speed of execution increased to challenge coordination with repeated repetitions   Standing at Bar: 6" step sandwiched between foam pads: stepping forward/backward with one foot staying on step. CGA for safety. Performed 12x each leg (24x total)   6 step sandwiched between foam pads: side step up/over. CGA for safety. 12x each side (24x total)  Forward lunges onto Bosu ball (flat side down). CGA for safety. 10x each leg. Patient requires SUE support on bar during task to avoid falling.   Sitting with feet on Airex pad: sit to stand with yellow medicine ball overhead press. 12x   Standing marches with yellow med ball overhead. PT cues for slower marching to  challenge unilateral stance. Patient demonstrates difficulty with prolonged single leg balance during task. 12x 2 sets.  Gait: Patient ambulates 98ft x 2 while self-tossing rainbow ball. Patient able to maintain straight path with close CGA. PT cues to toss ball faster to further challenge balance. Patient demonstrates near LOB x 2 with PT using min-mod A to stabilize.  Progressed to cognitive dual tasking as pt asked to name animals while tossing rainbow ball. Patient ambulates 1ft and names 8 animals. Patient deviates to R with decreased gait speed during task.       Pt educated throughout session about proper posture and technique with exercises. Improved exercise technique, movement at target joints, use of target muscles after min to mod verbal, visual, tactile cues.                    PT Education - 09/16/20 1252    Education Details exercise technique, body mechanics    Person(s) Educated Patient    Methods Demonstration;Tactile cues;Verbal cues;Explanation    Comprehension Verbalized understanding;Tactile cues required;Returned demonstration;Verbal cues required            PT Short Term Goals - 09/10/20 1019      PT SHORT TERM GOAL #1   Title Patient will be adherent to HEP at least 3x a week to improve functional strength and balance for better safety at home.    Time 4    Period Weeks    Status New    Target Date 10/08/20      PT SHORT TERM GOAL #2   Title Patient will improve balance as evidenced by being able to hold SLS for >5 sec without loss of balance to exhibit improved stance control and reduce fall risk.    Time 4    Period Weeks    Status New    Target Date 10/08/20             PT Long Term Goals - 09/10/20 1020      PT LONG TERM GOAL #1   Title Patient will increase Functional Gait Assessment score to >22/30 as to reduce fall risk and improve dynamic gait safety with community ambulation.    Time 6    Period Weeks    Status New     Target Date 10/22/20      PT LONG TERM GOAL #2   Title Patient will improve FOTO score to >60% to indicate improved functional mobility    Time 6    Period Weeks    Status New    Target Date 10/22/20      PT LONG TERM GOAL #3   Title Patient will be independent in walking on unlevel surfaces including ramp/curb negotiation without AD to improve safety in community.    Time 6  Period Weeks    Status New    Target Date 10/22/20                 Plan - 09/16/20 1351    Clinical Impression Statement Patient demonstrates improved balance and coordination this session with no overt LOB during simple balance tasks. Patient continues to require cues for pacing and proper execution of higher level dynamic balance tasks. Patient requires SUE support during Bosu lunges in order to stay upright. He would benefit from additional skilled PT Intervention to improve strength, balance and mobility    Personal Factors and Comorbidities Age;Comorbidity 3+;Time since onset of injury/illness/exacerbation    Comorbidities HTN, orthostatic hypotension, memory deficits, sleep apnea, high fall risk, arthritis with chronic low back pain;    Examination-Activity Limitations Carry;Bend;Stairs;Stand    Examination-Participation Restrictions Church;Cleaning;Community Activity;Shop;Volunteer;Yard Work    Stability/Clinical Decision Making Stable/Uncomplicated    Rehab Potential Good    PT Frequency 2x / week    PT Duration 6 weeks    PT Treatment/Interventions Cryotherapy;Moist Heat;Gait training;Stair training;Functional mobility training;Therapeutic activities;Therapeutic exercise;Balance training;Neuromuscular re-education;Patient/family education;Energy conservation    PT Next Visit Plan address balance deficits    PT Home Exercise Plan initiated, will continue to address    Consulted and Agree with Plan of Care Patient           Patient will benefit from skilled therapeutic intervention in  order to improve the following deficits and impairments:  Abnormal gait, Decreased balance, Decreased endurance, Decreased mobility, Difficulty walking, Decreased safety awareness, Decreased activity tolerance  Visit Diagnosis: Unsteadiness on feet  History of falling     Problem List Patient Active Problem List   Diagnosis Date Noted   Lumbar radiculopathy 08/25/2016   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 I have personally read, edited and approve of the note as written.  Precious Bard, PT, DPT   09/16/2020, 1:52 PM  [place] [facility] MAIN [facility] SERVICES [address] [place], [place], [address] Phone: [phone]   Fax:  [phone]  Name: Adam Bradshaw MRN: [id number] Date of Birth: August 30, 1946

## 2020-09-18 ENCOUNTER — Ambulatory Visit: Payer: Medicare Other | Admitting: Physical Therapy

## 2020-09-24 ENCOUNTER — Other Ambulatory Visit: Payer: Self-pay

## 2020-09-24 ENCOUNTER — Ambulatory Visit: Payer: Medicare Other

## 2020-09-24 DIAGNOSIS — Z9181 History of falling: Secondary | ICD-10-CM

## 2020-09-24 DIAGNOSIS — R2681 Unsteadiness on feet: Secondary | ICD-10-CM | POA: Diagnosis not present

## 2020-09-24 NOTE — Therapy (Signed)
[facility] MAIN [facility] SERVICES [address] [place], [place], [address] Phone: [phone]   Fax:  [phone]  Physical Therapy Treatment  Patient Details  Name: Adam Bradshaw MRN: [id number] Date of Birth: 31-May-1946 Referring Provider (PT): Dr. Sherryll Burger   Encounter Date: 09/24/2020   PT End of Session - 09/24/20 1155    Visit Number 4    Number of Visits 13    Date for PT Re-Evaluation 10/22/20    Authorization Type medicare    PT Start Time 1145    PT Stop Time 1225    PT Time Calculation (min) 40 min    Equipment Utilized During Treatment Gait belt    Activity Tolerance Patient tolerated treatment well;No increased pain    Behavior During Therapy WFL for tasks assessed/performed           Past Medical History:  Diagnosis Date  . Arthritis   . Headache   . Hypertension   . Pre-diabetes     Past Surgical History:  Procedure Laterality Date  . APPENDECTOMY    . basal skin surgery x2    . CERVICAL DISC SURGERY    . HAND SURGERY Right   . LUMBAR LAMINECTOMY/DECOMPRESSION MICRODISCECTOMY N/A 07/21/2016   Procedure: Lumbar four-five Laminectomy;  Surgeon: Hilda Lias, MD;  Location: [facility];  Service: Neurosurgery;  Laterality: N/A;  . LUMBAR WOUND DEBRIDEMENT N/A 08/25/2016   Procedure: Exploration of lumbar wound;  Surgeon: Hilda Lias, MD;  Location: [facility];  Service: Neurosurgery;  Laterality: N/A;  Exploration of lumbar wound  . SHOULDER SURGERY Right     There were no vitals filed for this visit.   Subjective Assessment - 09/24/20 1153    Subjective Pt doing well in general. Conitnues to work on his HEP at home. Pt feels that he is making progress with his balance at home.    Pertinent History 74 yo Male reports trouble with his balance over last year or so. He reports approximately 3-4 falls in last 6 months. He reports most falls occur when he trips over something or mis steps. He denies any  numbness/tingling. He is not currently using a RW at this time. He reports using SPC sometimes especially when walking outside of home. He rarely uses assistive device inside home. He reports occasional dizziness/lightheadedness especially when getting up from chair. He denies any falls associated with dizziness. He does have a PMH significant for chronic low back pain. He reports pain is worse in the morning. He does take pain medicine at night and when he gets up. He is unsure if back pain affects his balance. He also has a PMH significant for impaired memory/cognition.    Currently in Pain? No/denies           INTERVENTION THIS DATE:  AMB overground 1077ft (1.30m/s)  -Heel raises 1x25 (BUE support)  -Unsupported heel taps 1x30 alteranting sies (supervision level)  -airex pad trunk/ball rotation x30 -airex pad physio ball slam x15 (minguard assist, cues for head rotation to follow ball) -ariex physio ball overhead reach x15 (minguard assist)  -airex pad marching in place x20 (minguard assist, no LOB)  *following performed overground, minGuardAssist with gait belt -77ft overground AMB with GTB loop at gait belt resistance in 4 direction (4x)  -5ft AMB with farmer's carry 9lb free weights bilat  -50ft x2 AMB weaving 16 cones with farmer's carry 9lb free weights bilat  -3ft x2 AMB weaving 16 cones c 2.5lkb AW (  consider increasing to 5lb AW next session)  -4ft FWD c side stepping and cone kicking (alternating kicking foot)  -87ft AMB and cone search/pickup x15 (no LOB) -57ft side step c kickball rebounding off wall 1x each direction  (natural progression to high velocity, high power movements, has some deviation in path, without any unsteadiness, controls course correction well)      PT Short Term Goals - 09/10/20 1019      PT SHORT TERM GOAL #1   Title Patient will be adherent to HEP at least 3x a week to improve functional strength and balance for better safety at home.    Time 4     Period Weeks    Status New    Target Date 10/08/20      PT SHORT TERM GOAL #2   Title Patient will improve balance as evidenced by being able to hold SLS for >5 sec without loss of balance to exhibit improved stance control and reduce fall risk.    Time 4    Period Weeks    Status New    Target Date 10/08/20             PT Long Term Goals - 09/10/20 1020      PT LONG TERM GOAL #1   Title Patient will increase Functional Gait Assessment score to >22/30 as to reduce fall risk and improve dynamic gait safety with community ambulation.    Time 6    Period Weeks    Status New    Target Date 10/22/20      PT LONG TERM GOAL #2   Title Patient will improve FOTO score to >60% to indicate improved functional mobility    Time 6    Period Weeks    Status New    Target Date 10/22/20      PT LONG TERM GOAL #3   Title Patient will be independent in walking on unlevel surfaces including ramp/curb negotiation without AD to improve safety in community.    Time 6    Period Weeks    Status New    Target Date 10/22/20                 Plan - 09/24/20 1158    Clinical Impression Statement Continued to work on balance and overground gait control. Program this date progressed to more walking based activities, perturbation at leg level, arm level, pelvis level, and stepping in frontal, sagittal, and transverse planes. Pt able to perform high level activity without any actually LOB. Performance is most deviated with sie stepping and ball rebound, but this is perfomred with low anxiety and good control of line of progression deviation.    Personal Factors and Comorbidities Age;Comorbidity 3+;Time since onset of injury/illness/exacerbation    Comorbidities HTN, orthostatic hypotension, memory deficits, sleep apnea, high fall risk, arthritis with chronic low back pain;    Examination-Activity Limitations Carry;Bend;Stairs;Stand    Examination-Participation Restrictions  Church;Cleaning;Community Activity;Shop;Volunteer;Yard Work    Conservation officer, historic buildings Stable/Uncomplicated    Optometrist Low    Rehab Potential Good    PT Frequency 2x / week    PT Duration 6 weeks    PT Treatment/Interventions Cryotherapy;Moist Heat;Gait training;Stair training;Functional mobility training;Therapeutic activities;Therapeutic exercise;Balance training;Neuromuscular re-education;Patient/family education;Energy conservation    PT Next Visit Plan address balance deficits    PT Home Exercise Plan no updates this session    Consulted and Agree with Plan of Care Patient  Patient will benefit from skilled therapeutic intervention in order to improve the following deficits and impairments:  Abnormal gait, Decreased balance, Decreased endurance, Decreased mobility, Difficulty walking, Decreased safety awareness, Decreased activity tolerance  Visit Diagnosis: Unsteadiness on feet  History of falling     Problem List Patient Active Problem List   Diagnosis Date Noted  . Lumbar radiculopathy 08/25/2016  . 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 [place], [place], [address] Phone: [phone]   Fax:  [phone]  Name: Adam Bradshaw MRN: [id number] Date of Birth: 08-02-1946

## 2020-09-26 ENCOUNTER — Other Ambulatory Visit: Payer: Self-pay

## 2020-09-26 ENCOUNTER — Encounter: Payer: Self-pay | Admitting: Physical Therapy

## 2020-09-26 ENCOUNTER — Ambulatory Visit: Payer: Medicare Other | Admitting: Physical Therapy

## 2020-09-26 DIAGNOSIS — Z9181 History of falling: Secondary | ICD-10-CM

## 2020-09-26 DIAGNOSIS — R2681 Unsteadiness on feet: Secondary | ICD-10-CM | POA: Diagnosis not present

## 2020-09-26 NOTE — Therapy (Signed)
[facility] MAIN [facility] SERVICES [address] [place], [place], [address] Phone: [phone]   Fax:  [phone]  Physical Therapy Treatment  Patient Details  Name: Adam Bradshaw MRN: [id number] Date of Birth: Nov 09, 1946 Referring Provider (PT): Dr. Sherryll Burger   Encounter Date: 09/26/2020   PT End of Session - 09/26/20 1155    Visit Number 5    Number of Visits 13    Date for PT Re-Evaluation 10/22/20    Authorization Type medicare    PT Start Time 1146    PT Stop Time 1230    PT Time Calculation (min) 44 min    Equipment Utilized During Treatment Gait belt    Activity Tolerance Patient tolerated treatment well;No increased pain    Behavior During Therapy WFL for tasks assessed/performed           Past Medical History:  Diagnosis Date   Arthritis    Headache    Hypertension    Pre-diabetes     Past Surgical History:  Procedure Laterality Date   APPENDECTOMY     basal skin surgery x2     CERVICAL DISC SURGERY     HAND SURGERY Right    LUMBAR LAMINECTOMY/DECOMPRESSION MICRODISCECTOMY N/A 07/21/2016   Procedure: Lumbar four-five Laminectomy;  Surgeon: Hilda Lias, MD;  Location: [facility];  Service: Neurosurgery;  Laterality: N/A;   LUMBAR WOUND DEBRIDEMENT N/A 08/25/2016   Procedure: Exploration of lumbar wound;  Surgeon: Hilda Lias, MD;  Location: [facility];  Service: Neurosurgery;  Laterality: N/A;  Exploration of lumbar wound   SHOULDER SURGERY Right     There were no vitals filed for this visit.   Subjective Assessment - 09/26/20 1154    Subjective Patient reports mild soreness. He reports his legs felt better after walking around the parking lot. He reports adherence with HEP but states he is having trouble with heel/toe and with SLS.    Pertinent History 74 yo Male reports trouble with his balance over last year or so. He reports approximately 3-4 falls in last 6 months. He reports most falls occur when  he trips over something or mis steps. He denies any numbness/tingling. He is not currently using a RW at this time. He reports using SPC sometimes especially when walking outside of home. He rarely uses assistive device inside home. He reports occasional dizziness/lightheadedness especially when getting up from chair. He denies any falls associated with dizziness. He does have a PMH significant for chronic low back pain. He reports pain is worse in the morning. He does take pain medicine at night and when he gets up. He is unsure if back pain affects his balance. He also has a PMH significant for impaired memory/cognition.    Currently in Pain? No/denies    Multiple Pain Sites No           Warm up on Crosstrainer level 2 x3 min (unbilled);   Patient instructed in advanced balance exercise  Standing in parallel bars:  airex beam: -tandem stance unsupported 10 sec hold x2 reps each foot in front; -tandem stance unsupported with BUE wand flexion x3 reps, x4 sets each foot in front; -side stepping unsupported x 4 laps each direction; Patient does exhibit increased ankle instability and requires hip strategies for balance control especially with reduced rail assist;   Standing on 1/2 bolster: -feet apart forward/backward ankle rock x15 reps with and without rail assist, required mod VCs to avoid knee flexion and isolate  ankle ROM; -tandem stance:  Unsupported x10 sec each foot in front  Unsupported with ball pass side/side x10 reps each foot in front with min A for safety with decreased weight shift and poor ankle strategies noted;    Standing next to aerobic step with 1 riser (6 inch total) -alternate quick toe taps x30 sec x2 sets with min A and increased unsteadiness/loss of balance noted with increased speed -forward/backward step ups  While holding green pball x10 reps;  While holding green pball up/down x10 reps; -side stepping over step x4 steps unsupported -side stepping with  green pball circles for dual tasks x5 reps each direction Required min VCS for sequencing especially with dual tasks to improve proper exercise technique; Required CGA to min A for safety;   Ladder drills: -forward walking x1 lap unsupported -forward walk with contralateral LE SLS 3 sec hold x2 laps each with min A for safety -zig/zag side step with forward/backward stepping to challenge coordination x1 lap each direction;   Patient tolerated session well. He did require min A with advanced balance exercise particularly when standing on foam/1/2 bolster without rail assist. He also required min VCS for weight shift and to increase step length for better balance when standing and negotiating obstacles. Denies any pain or fatigue at end of session;                           PT Education - 09/26/20 1155    Education Details balance/gait safety, HEP    Person(s) Educated Patient    Methods Explanation;Verbal cues    Comprehension Verbalized understanding;Returned demonstration;Verbal cues required;Need further instruction            PT Short Term Goals - 09/10/20 1019      PT SHORT TERM GOAL #1   Title Patient will be adherent to HEP at least 3x a week to improve functional strength and balance for better safety at home.    Time 4    Period Weeks    Status New    Target Date 10/08/20      PT SHORT TERM GOAL #2   Title Patient will improve balance as evidenced by being able to hold SLS for >5 sec without loss of balance to exhibit improved stance control and reduce fall risk.    Time 4    Period Weeks    Status New    Target Date 10/08/20             PT Long Term Goals - 09/10/20 1020      PT LONG TERM GOAL #1   Title Patient will increase Functional Gait Assessment score to >22/30 as to reduce fall risk and improve dynamic gait safety with community ambulation.    Time 6    Period Weeks    Status New    Target Date 10/22/20      PT LONG TERM  GOAL #2   Title Patient will improve FOTO score to >60% to indicate improved functional mobility    Time 6    Period Weeks    Status New    Target Date 10/22/20      PT LONG TERM GOAL #3   Title Patient will be independent in walking on unlevel surfaces including ramp/curb negotiation without AD to improve safety in community.    Time 6    Period Weeks    Status New    Target Date 10/22/20  Plan - 09/26/20 1228    Clinical Impression Statement Patient motivated and participated well within session. Patient instructed in advanced balance exercise. He required CGA to min A with most balance exercise especially with narrow base of support. He had a difficult time with tandem stance on compliant surfaces. Patient also exhibits increased difficulty with quick movements with increased unsteadiness and loss of balance with quick alternate toe taps. Reinforced HEP. He would benefit from additional skilled PT intervention to improve strength, balance and mobility;    Personal Factors and Comorbidities Age;Comorbidity 3+;Time since onset of injury/illness/exacerbation    Comorbidities HTN, orthostatic hypotension, memory deficits, sleep apnea, high fall risk, arthritis with chronic low back pain;    Examination-Activity Limitations Carry;Bend;Stairs;Stand    Examination-Participation Restrictions Church;Cleaning;Community Activity;Shop;Volunteer;Yard Work    Stability/Clinical Decision Making Stable/Uncomplicated    Rehab Potential Good    PT Frequency 2x / week    PT Duration 6 weeks    PT Treatment/Interventions Cryotherapy;Moist Heat;Gait training;Stair training;Functional mobility training;Therapeutic activities;Therapeutic exercise;Balance training;Neuromuscular re-education;Patient/family education;Energy conservation    PT Next Visit Plan address balance deficits    PT Home Exercise Plan no updates this session    Consulted and Agree with Plan of Care Patient            Patient will benefit from skilled therapeutic intervention in order to improve the following deficits and impairments:  Abnormal gait, Decreased balance, Decreased endurance, Decreased mobility, Difficulty walking, Decreased safety awareness, Decreased activity tolerance  Visit Diagnosis: Unsteadiness on feet  History of falling     Problem List Patient Active Problem List   Diagnosis Date Noted   Lumbar radiculopathy 08/25/2016   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 [place], [place], [address] Phone: [phone]   Fax:  [phone]  Name: Adam Bradshaw MRN: [id number] Date of Birth: [birth year]

## 2020-09-30 ENCOUNTER — Ambulatory Visit: Payer: Medicare Other | Admitting: Physical Therapy

## 2020-10-02 ENCOUNTER — Encounter: Payer: Self-pay | Admitting: Physical Therapy

## 2020-10-02 ENCOUNTER — Other Ambulatory Visit: Payer: Self-pay

## 2020-10-02 ENCOUNTER — Ambulatory Visit: Payer: Medicare Other | Admitting: Physical Therapy

## 2020-10-02 DIAGNOSIS — Z9181 History of falling: Secondary | ICD-10-CM

## 2020-10-02 DIAGNOSIS — R2681 Unsteadiness on feet: Secondary | ICD-10-CM

## 2020-10-02 NOTE — Therapy (Signed)
[facility] MAIN [facility] SERVICES [address] [place], [place], [address] Phone: [phone]   Fax:  [phone]  Physical Therapy Treatment  Patient Details  Name: Adam Bradshaw MRN: [id number] Date of Birth: 25-May-1946 Referring Provider (PT): Dr. Sherryll Burger   Encounter Date: 10/02/2020   PT End of Session - 10/02/20 1111    Visit Number 6    Number of Visits 13    Date for PT Re-Evaluation 10/22/20    Authorization Type medicare    PT Start Time 1105    PT Stop Time 1145    PT Time Calculation (min) 40 min    Equipment Utilized During Treatment Gait belt    Activity Tolerance Patient tolerated treatment well;No increased pain    Behavior During Therapy WFL for tasks assessed/performed           Past Medical History:  Diagnosis Date  . Arthritis   . Headache   . Hypertension   . Pre-diabetes     Past Surgical History:  Procedure Laterality Date  . APPENDECTOMY    . basal skin surgery x2    . CERVICAL DISC SURGERY    . HAND SURGERY Right   . LUMBAR LAMINECTOMY/DECOMPRESSION MICRODISCECTOMY N/A 07/21/2016   Procedure: Lumbar four-five Laminectomy;  Surgeon: Hilda Lias, MD;  Location: [facility];  Service: Neurosurgery;  Laterality: N/A;  . LUMBAR WOUND DEBRIDEMENT N/A 08/25/2016   Procedure: Exploration of lumbar wound;  Surgeon: Hilda Lias, MD;  Location: [facility];  Service: Neurosurgery;  Laterality: N/A;  Exploration of lumbar wound  . SHOULDER SURGERY Right     There were no vitals filed for this visit.   Subjective Assessment - 10/02/20 1110    Subjective Patient reports doing well. He reports having episodes of dizziness this week but denies any falls. He denies any soreness today;    Pertinent History 74 yo Male reports trouble with his balance over last year or so. He reports approximately 3-4 falls in last 6 months. He reports most falls occur when he trips over something or mis steps. He denies any  numbness/tingling. He is not currently using a RW at this time. He reports using SPC sometimes especially when walking outside of home. He rarely uses assistive device inside home. He reports occasional dizziness/lightheadedness especially when getting up from chair. He denies any falls associated with dizziness. He does have a PMH significant for chronic low back pain. He reports pain is worse in the morning. He does take pain medicine at night and when he gets up. He is unsure if back pain affects his balance. He also has a PMH significant for impaired memory/cognition.    Currently in Pain? No/denies    Multiple Pain Sites No               Warm up on Crosstrainer level 2 x3 min (unbilled);   Patient instructed in advanced balance exercise  Standing in parallel bars:  airex beam: -tandem stance unsupported 10 sec hold x4 reps each foot in front; -tandem stance unsupported with BUE ball pass side/side x3 reps, x2 laps  -side stepping unsupported x 5 laps each direction; -feet apart (shoulder width) balloon taps x2 min with supervision and cues for weight shift;  Patient does exhibit increased ankle instability and requires hip strategies for balance control especially with reduced rail assist;   Standing on 1/2 bolster: -feet apart forward/backward ankle rock x15 reps with and without rail assist, required mod  VCs to avoid knee flexion and isolate ankle ROM; -tandem stance:             Unsupported x10 sec each foot in front             Unsupported with ball pass side/side x10 reps each foot in front with min A for safety with decreased weight shift and poor ankle strategies noted;    Resisted walking 12.5# forward/backward, side/side x4 way, x2 laps each with red mat for increased challenge with unstable surface; Required CGA for safety;   Gait in hallway:  Forward/backward walking with ball pass side/side x80 feet each; Forward walking with ball bounce and catch x80  feet Forward walking with ball toss and catch x80 feet Side stepping x30 feet with ball toss/catch each direction Patient required close supervision; He does exhibit good reciprocal gait pattern with minimal veering and no instability noted;    Patient tolerated session well. He did require min A with advanced balance exercise particularly when standing on foam/1/2 bolster without rail assist. He also required min VCS for weight shift and to increase step length for better balance when standing and negotiating obstacles. Denies any pain or fatigue at end of session;                          PT Education - 10/02/20 1110    Education Details balance/gait safety; HEP    Person(s) Educated Patient    Methods Explanation;Verbal cues    Comprehension Returned demonstration;Verbalized understanding;Verbal cues required;Need further instruction            PT Short Term Goals - 09/10/20 1019      PT SHORT TERM GOAL #1   Title Patient will be adherent to HEP at least 3x a week to improve functional strength and balance for better safety at home.    Time 4    Period Weeks    Status New    Target Date 10/08/20      PT SHORT TERM GOAL #2   Title Patient will improve balance as evidenced by being able to hold SLS for >5 sec without loss of balance to exhibit improved stance control and reduce fall risk.    Time 4    Period Weeks    Status New    Target Date 10/08/20             PT Long Term Goals - 09/10/20 1020      PT LONG TERM GOAL #1   Title Patient will increase Functional Gait Assessment score to >22/30 as to reduce fall risk and improve dynamic gait safety with community ambulation.    Time 6    Period Weeks    Status New    Target Date 10/22/20      PT LONG TERM GOAL #2   Title Patient will improve FOTO score to >60% to indicate improved functional mobility    Time 6    Period Weeks    Status New    Target Date 10/22/20      PT LONG TERM  GOAL #3   Title Patient will be independent in walking on unlevel surfaces including ramp/curb negotiation without AD to improve safety in community.    Time 6    Period Weeks    Status New    Target Date 10/22/20                 Plan - 10/02/20  1305    Clinical Impression Statement Patient motivated and participated well within session. Patient does exhibit increased ankle instability especially while on compliant surfaces. Patient does exhibit increased lean to right side requiring min A for safety. Patient reports feeling most unsteady while walking. He was instructed in walking against resistance and with various dual tasks. Patient able to exhibit good control with no veering or instability noted. He would benefit from additional skilled PT Intervention to improve strength, balance and mobility;    Personal Factors and Comorbidities Age;Comorbidity 3+;Time since onset of injury/illness/exacerbation    Comorbidities HTN, orthostatic hypotension, memory deficits, sleep apnea, high fall risk, arthritis with chronic low back pain;    Examination-Activity Limitations Carry;Bend;Stairs;Stand    Examination-Participation Restrictions Church;Cleaning;Community Activity;Shop;Volunteer;Yard Work    Stability/Clinical Decision Making Stable/Uncomplicated    Rehab Potential Good    PT Frequency 2x / week    PT Duration 6 weeks    PT Treatment/Interventions Cryotherapy;Moist Heat;Gait training;Stair training;Functional mobility training;Therapeutic activities;Therapeutic exercise;Balance training;Neuromuscular re-education;Patient/family education;Energy conservation    PT Next Visit Plan address balance deficits    PT Home Exercise Plan no updates this session    Consulted and Agree with Plan of Care Patient           Patient will benefit from skilled therapeutic intervention in order to improve the following deficits and impairments:  Abnormal gait, Decreased balance, Decreased  endurance, Decreased mobility, Difficulty walking, Decreased safety awareness, Decreased activity tolerance  Visit Diagnosis: Unsteadiness on feet  History of falling     Problem List Patient Active Problem List   Diagnosis Date Noted  . Lumbar radiculopathy 08/25/2016  . 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

## 2020-10-07 ENCOUNTER — Ambulatory Visit: Payer: Medicare Other | Admitting: Physical Therapy

## 2020-10-09 ENCOUNTER — Ambulatory Visit: Payer: Medicare Other | Attending: Neurology

## 2020-10-09 ENCOUNTER — Other Ambulatory Visit: Payer: Self-pay

## 2020-10-09 DIAGNOSIS — Z9181 History of falling: Secondary | ICD-10-CM | POA: Insufficient documentation

## 2020-10-09 DIAGNOSIS — R2681 Unsteadiness on feet: Secondary | ICD-10-CM | POA: Insufficient documentation

## 2020-10-09 NOTE — Therapy (Signed)
[facility] MAIN [facility] SERVICES [address] [place], [place], [address] Phone: [phone]   Fax:  [phone]  Physical Therapy Treatment  Patient Details  Name: Adam Bradshaw MRN: [id number] Date of Birth: September 25, 1946 Referring Provider (PT): Dr. Sherryll Burger   Encounter Date: 10/09/2020   PT End of Session - 10/09/20 1106    Visit Number 7    Number of Visits 13    Date for PT Re-Evaluation 10/22/20    Authorization Type medicare    PT Start Time 1015    PT Stop Time 1058    PT Time Calculation (min) 43 min    Equipment Utilized During Treatment Gait belt    Activity Tolerance Patient tolerated treatment well;No increased pain    Behavior During Therapy WFL for tasks assessed/performed           Past Medical History:  Diagnosis Date  . Arthritis   . Headache   . Hypertension   . Pre-diabetes     Past Surgical History:  Procedure Laterality Date  . APPENDECTOMY    . basal skin surgery x2    . CERVICAL DISC SURGERY    . HAND SURGERY Right   . LUMBAR LAMINECTOMY/DECOMPRESSION MICRODISCECTOMY N/A 07/21/2016   Procedure: Lumbar four-five Laminectomy;  Surgeon: Hilda Lias, MD;  Location: [facility];  Service: Neurosurgery;  Laterality: N/A;  . LUMBAR WOUND DEBRIDEMENT N/A 08/25/2016   Procedure: Exploration of lumbar wound;  Surgeon: Hilda Lias, MD;  Location: [facility];  Service: Neurosurgery;  Laterality: N/A;  Exploration of lumbar wound  . SHOULDER SURGERY Right     There were no vitals filed for this visit.   Subjective Assessment - 10/09/20 1103    Subjective Patient reports dizziness began at the end of last week and is affecting him badly. Looking up and down is the worst.  Reports he has never had this dizziness before. Is trying to go to the doctor, will go Friday. Reports he is having headaches    Pertinent History 73 yo Male reports trouble with his balance over last year or so. He reports approximately 3-4 falls  in last 6 months. He reports most falls occur when he trips over something or mis steps. He denies any numbness/tingling. He is not currently using a RW at this time. He reports using SPC sometimes especially when walking outside of home. He rarely uses assistive device inside home. He reports occasional dizziness/lightheadedness especially when getting up from chair. He denies any falls associated with dizziness. He does have a PMH significant for chronic low back pain. He reports pain is worse in the morning. He does take pain medicine at night and when he gets up. He is unsure if back pain affects his balance. He also has a PMH significant for impaired memory/cognition.    Limitations Standing;Walking    How long can you sit comfortably? NA, will have stiffness when he gets up after sitting for a while    How long can you stand comfortably? 10-15 min, limited by back pain and fatigue    How long can you walk comfortably? about 50 yards    Patient Stated Goals "get my balance back."    Currently in Pain? Yes    Pain Score 2     Pain Location Head    Pain Orientation Posterior    Pain Descriptors / Indicators Aching    Pain Type Chronic pain    Pain Onset More than a  month ago    Pain Frequency Intermittent              Patient reports dizziness began at the end of last week and is affecting him badly. Looking up and down is the worst.  Reports he has never had this dizziness before. Is trying to go to the doctor, will go Friday. Reports he is having headaches  Blood pressure at start of session:  Sitting:148/79  Stance: 101/73   -patient agreeable to discuss blood pressure changes with physician, will call as soon as he gets home.   BP at end of session: 138/ 71   standing in // bars: -airex pad: static stand 30 seconds  -airex pad 6" step , 2x 45 second hold for modified tandem stance  -airex pad: 6" step toe taps 10x each LE  -airex pad : eyes closed 40 seconds   -airex pad:  one foot each airex pad for tandem stance, reach with RUE to outside BOS for ball, throw at hoop x 18 each stance.   -airex balance beam: side stepping 4x length of // bars, challenge with foot placement -airex balance beam: tandem walking, multiple near LOB 6x length of // bars.   4lb ankle weight -grapevine 4x length of // bars challenging with LOB when crossing midline -side steps, challenging with foot placement 4x length of // bars -marching 4x length of // bars no UE support ; challenging single limb stabilization    Vitals monitored throughout session, rest breaks given as needed.                      PT Education - 10/09/20 1105    Education Details need to follow up with doctor. wife agreeable to notify doctor.    Person(s) Educated Patient;Spouse    Methods Explanation    Comprehension Verbalized understanding            PT Short Term Goals - 09/10/20 1019      PT SHORT TERM GOAL #1   Title Patient will be adherent to HEP at least 3x a week to improve functional strength and balance for better safety at home.    Time 4    Period Weeks    Status New    Target Date 10/08/20      PT SHORT TERM GOAL #2   Title Patient will improve balance as evidenced by being able to hold SLS for >5 sec without loss of balance to exhibit improved stance control and reduce fall risk.    Time 4    Period Weeks    Status New    Target Date 10/08/20             PT Long Term Goals - 09/10/20 1020      PT LONG TERM GOAL #1   Title Patient will increase Functional Gait Assessment score to >22/30 as to reduce fall risk and improve dynamic gait safety with community ambulation.    Time 6    Period Weeks    Status New    Target Date 10/22/20      PT LONG TERM GOAL #2   Title Patient will improve FOTO score to >60% to indicate improved functional mobility    Time 6    Period Weeks    Status New    Target Date 10/22/20      PT LONG TERM GOAL #3   Title Patient  will be independent in walking on unlevel  surfaces including ramp/curb negotiation without AD to improve safety in community.    Time 6    Period Weeks    Status New    Target Date 10/22/20                 Plan - 10/09/20 1107    Clinical Impression Statement Patient presents with high levels of dizziness. Upon testing blood pressure is found to be orthostatic from sitting to standing with a dro pof ~40 in systolic. Patient given numbers and agreeable to call physician after visit. Tolerates gentle stability interventions this session well with no increase in dizziness. He would benefit from additional skilled PT Intervention to improve strength, balance and mobility;    Personal Factors and Comorbidities Age;Comorbidity 3+;Time since onset of injury/illness/exacerbation    Comorbidities HTN, orthostatic hypotension, memory deficits, sleep apnea, high fall risk, arthritis with chronic low back pain;    Examination-Activity Limitations Carry;Bend;Stairs;Stand    Examination-Participation Restrictions Church;Cleaning;Community Activity;Shop;Volunteer;Yard Work    Stability/Clinical Decision Making Stable/Uncomplicated    Rehab Potential Good    PT Frequency 2x / week    PT Duration 6 weeks    PT Treatment/Interventions Cryotherapy;Moist Heat;Gait training;Stair training;Functional mobility training;Therapeutic activities;Therapeutic exercise;Balance training;Neuromuscular re-education;Patient/family education;Energy conservation    PT Next Visit Plan address balance deficits    PT Home Exercise Plan no updates this session    Consulted and Agree with Plan of Care Patient           Patient will benefit from skilled therapeutic intervention in order to improve the following deficits and impairments:  Abnormal gait, Decreased balance, Decreased endurance, Decreased mobility, Difficulty walking, Decreased safety awareness, Decreased activity tolerance  Visit Diagnosis: Unsteadiness on  feet  History of falling     Problem List Patient Active Problem List   Diagnosis Date Noted  . Lumbar radiculopathy 08/25/2016  . 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 SERVICES [address] [place], [place], [address] Phone: [phone]   Fax:  [phone]  Name: Adam Bradshaw MRN: [id number] Date of Birth: June 02, 1946

## 2020-10-14 ENCOUNTER — Encounter: Payer: Self-pay | Admitting: Physical Therapy

## 2020-10-14 ENCOUNTER — Other Ambulatory Visit: Payer: Self-pay

## 2020-10-14 ENCOUNTER — Ambulatory Visit: Payer: Medicare Other | Admitting: Physical Therapy

## 2020-10-14 DIAGNOSIS — R2681 Unsteadiness on feet: Secondary | ICD-10-CM

## 2020-10-14 DIAGNOSIS — Z9181 History of falling: Secondary | ICD-10-CM

## 2020-10-14 NOTE — Therapy (Signed)
[facility] MAIN [facility] SERVICES [address] [place], [place], [address] Phone: [phone]   Fax:  [phone]  Physical Therapy Treatment  Patient Details  Name: Adam Bradshaw MRN: [id number] Date of Birth: [birth year] Referring Provider (PT): Dr. Sherryll Burger   Encounter Date: 10/14/2020   PT End of Session - 10/14/20 1108    Visit Number 8    Number of Visits 13    Date for PT Re-Evaluation 10/22/20    Authorization Type medicare    PT Start Time 1102    PT Stop Time 1145    PT Time Calculation (min) 43 min    Equipment Utilized During Treatment Gait belt    Activity Tolerance Patient tolerated treatment well;No increased pain    Behavior During Therapy WFL for tasks assessed/performed           Past Medical History:  Diagnosis Date  . Arthritis   . Headache   . Hypertension   . Pre-diabetes     Past Surgical History:  Procedure Laterality Date  . APPENDECTOMY    . basal skin surgery x2    . CERVICAL DISC SURGERY    . HAND SURGERY Right   . LUMBAR LAMINECTOMY/DECOMPRESSION MICRODISCECTOMY N/A 07/21/2016   Procedure: Lumbar four-five Laminectomy;  Surgeon: Hilda Lias, MD;  Location: [facility];  Service: Neurosurgery;  Laterality: N/A;  . LUMBAR WOUND DEBRIDEMENT N/A 08/25/2016   Procedure: Exploration of lumbar wound;  Surgeon: Hilda Lias, MD;  Location: [facility];  Service: Neurosurgery;  Laterality: N/A;  Exploration of lumbar wound  . SHOULDER SURGERY Right     There were no vitals filed for this visit.   Subjective Assessment - 10/14/20 1107    Subjective Patient reports his dizziness is better than last week. He did go see his MD who prescribed blood thinner last week. He reports feeling more cold since starting that.    Pertinent History 74 yo Male reports trouble with his balance over last year or so. He reports approximately 3-4 falls in last 6 months. He reports most falls occur when he trips over something or  mis steps. He denies any numbness/tingling. He is not currently using a RW at this time. He reports using SPC sometimes especially when walking outside of home. He rarely uses assistive device inside home. He reports occasional dizziness/lightheadedness especially when getting up from chair. He denies any falls associated with dizziness. He does have a PMH significant for chronic low back pain. He reports pain is worse in the morning. He does take pain medicine at night and when he gets up. He is unsure if back pain affects his balance. He also has a PMH significant for impaired memory/cognition.    Limitations Standing;Walking    How long can you sit comfortably? NA, will have stiffness when he gets up after sitting for a while    How long can you stand comfortably? 10-15 min, limited by back pain and fatigue    How long can you walk comfortably? about 50 yards    Patient Stated Goals "get my balance back."    Currently in Pain? No/denies    Pain Onset More than a month ago    Multiple Pain Sites No                Warm up on Nustep level 2 x4 min (Unbilled);  Vitals at start of session: 123/67 Patient instructed in advanced balance exercise  Standingin parallel bars:  airex beam: -tandem stance unsupported 10 sec hold x6 reps each foot in front; -tandem stance unsupported with BUE ball pass side/side x3 reps, x2 laps  -side stepping unsupported x 5 laps each direction; -feet apart (shoulder width) ball toss and catch x15 reps with supervision and cues for weight shift;  Patient does exhibit increased ankle instability and requires hip strategies for balance control especially with reduced rail assist;    Standing on airex:  -alternate toe taps to 8 inch step x15 reps -standing one foot on airex, one foot on 8 inch step:  Unsupported head turns side/side x5 reps  Unsupported head turns up/down x5 reps;  Unsupported ball toss/catch x10 reps each LE Patient does require min A  for safety with increased unsteadiness noted especially with lateral head turns;   Weaving around cones #6 x4 laps with close supervision with cues to increase step length for walking around cones  Side stepping over cones #6 x2 laps each direction with min A for safety and cues to increase step length for better foot clearance;   Patient tolerated session well. He did require min A with advanced balance exercise particularly when standing on foamwithout rail assist.He also required min VCS for weight shift and to increase step length for better balance when standing and negotiating obstacles. Denies any pain or fatigue at end of session; Vitals 125/75 following session;                       PT Education - 10/14/20 1108    Education Details balance/gait safety, HEP    Person(s) Educated Patient    Methods Explanation;Verbal cues    Comprehension Verbalized understanding;Returned demonstration;Verbal cues required;Need further instruction            PT Short Term Goals - 09/10/20 1019      PT SHORT TERM GOAL #1   Title Patient will be adherent to HEP at least 3x a week to improve functional strength and balance for better safety at home.    Time 4    Period Weeks    Status New    Target Date 10/08/20      PT SHORT TERM GOAL #2   Title Patient will improve balance as evidenced by being able to hold SLS for >5 sec without loss of balance to exhibit improved stance control and reduce fall risk.    Time 4    Period Weeks    Status New    Target Date 10/08/20             PT Long Term Goals - 09/10/20 1020      PT LONG TERM GOAL #1   Title Patient will increase Functional Gait Assessment score to >22/30 as to reduce fall risk and improve dynamic gait safety with community ambulation.    Time 6    Period Weeks    Status New    Target Date 10/22/20      PT LONG TERM GOAL #2   Title Patient will improve FOTO score to >60% to indicate improved  functional mobility    Time 6    Period Weeks    Status New    Target Date 10/22/20      PT LONG TERM GOAL #3   Title Patient will be independent in walking on unlevel surfaces including ramp/curb negotiation without AD to improve safety in community.    Time 6    Period Weeks    Status  New    Target Date 10/22/20                 Plan - 10/14/20 1222    Clinical Impression Statement Patient motivated and participated well within session. Patient does exhibit normal BP during session. He was instructed in advanced balance exercise standing on unlevel surfaces. Patient did have difficulty with reduced rail assist especially while on compliant surfaces. He does exhibit decreased ankle strategies which contriutes to imbalance. He would benefit from additional skilled PT intervention to improve strength, balance and mobility.    Personal Factors and Comorbidities Age;Comorbidity 3+;Time since onset of injury/illness/exacerbation    Comorbidities HTN, orthostatic hypotension, memory deficits, sleep apnea, high fall risk, arthritis with chronic low back pain;    Examination-Activity Limitations Carry;Bend;Stairs;Stand    Examination-Participation Restrictions Church;Cleaning;Community Activity;Shop;Volunteer;Yard Work    Stability/Clinical Decision Making Stable/Uncomplicated    Rehab Potential Good    PT Frequency 2x / week    PT Duration 6 weeks    PT Treatment/Interventions Cryotherapy;Moist Heat;Gait training;Stair training;Functional mobility training;Therapeutic activities;Therapeutic exercise;Balance training;Neuromuscular re-education;Patient/family education;Energy conservation    PT Next Visit Plan address balance deficits    PT Home Exercise Plan no updates this session    Consulted and Agree with Plan of Care Patient           Patient will benefit from skilled therapeutic intervention in order to improve the following deficits and impairments:  Abnormal gait, Decreased  balance, Decreased endurance, Decreased mobility, Difficulty walking, Decreased safety awareness, Decreased activity tolerance  Visit Diagnosis: Unsteadiness on feet  History of falling     Problem List Patient Active Problem List   Diagnosis Date Noted  . Lumbar radiculopathy 08/25/2016  . Lumbar stenosis with neurogenic claudication 07/21/2016    Merek Niu PT, DPT 10/14/2020, 12:24 PM  [place] [facility] MAIN [facility] SERVICES [address] [place], [place], [address] Phone: [phone]   Fax:  [phone]  Name: DREW LIPS MRN: [id number] Date of Birth: February 09, 1946

## 2020-10-16 ENCOUNTER — Ambulatory Visit: Payer: Medicare Other | Admitting: Physical Therapy

## 2020-10-17 ENCOUNTER — Other Ambulatory Visit: Payer: Self-pay

## 2020-10-17 ENCOUNTER — Encounter: Payer: Self-pay | Admitting: Physical Therapy

## 2020-10-17 ENCOUNTER — Ambulatory Visit: Payer: Medicare Other | Admitting: Physical Therapy

## 2020-10-17 DIAGNOSIS — R2681 Unsteadiness on feet: Secondary | ICD-10-CM | POA: Diagnosis not present

## 2020-10-17 DIAGNOSIS — Z9181 History of falling: Secondary | ICD-10-CM

## 2020-10-17 NOTE — Therapy (Signed)
[place] [facility] MAIN [facility] SERVICES [address] [place], [place], [address] Phone: [phone]   Fax:  [phone]  Physical Therapy Treatment  Patient Details  Name: Adam Bradshaw MRN: [id number] Date of Birth: July 01, 1946 Referring Provider (PT): Dr. Sherryll Burger   Encounter Date: 10/17/2020   PT End of Session - 10/17/20 0939    Visit Number 9    Number of Visits 13    Date for PT Re-Evaluation 10/22/20    Authorization Type medicare    Equipment Utilized During Treatment Gait belt    Activity Tolerance Patient tolerated treatment well;No increased pain    Behavior During Therapy WFL for tasks assessed/performed           Past Medical History:  Diagnosis Date  . Arthritis   . Headache   . Hypertension   . Pre-diabetes     Past Surgical History:  Procedure Laterality Date  . APPENDECTOMY    . basal skin surgery x2    . CERVICAL DISC SURGERY    . HAND SURGERY Right   . LUMBAR LAMINECTOMY/DECOMPRESSION MICRODISCECTOMY N/A 07/21/2016   Procedure: Lumbar four-five Laminectomy;  Surgeon: Hilda Lias, MD;  Location: [facility];  Service: Neurosurgery;  Laterality: N/A;  . LUMBAR WOUND DEBRIDEMENT N/A 08/25/2016   Procedure: Exploration of lumbar wound;  Surgeon: Hilda Lias, MD;  Location: [facility];  Service: Neurosurgery;  Laterality: N/A;  Exploration of lumbar wound  . SHOULDER SURGERY Right     There were no vitals filed for this visit.   Subjective Assessment - 10/17/20 0936    Subjective Patient reports his dizziness is better than last week. He did go see his MD who prescribed blood thinner last week. He reports feeling more cold since starting that.    Pertinent History 74 yo Male reports trouble with his balance over last year or so. He reports approximately 3-4 falls in last 6 months. He reports most falls occur when he trips over something or mis steps. He denies any numbness/tingling. He is not currently using a RW at  this time. He reports using SPC sometimes especially when walking outside of home. He rarely uses assistive device inside home. He reports occasional dizziness/lightheadedness especially when getting up from chair. He denies any falls associated with dizziness. He does have a PMH significant for chronic low back pain. He reports pain is worse in the morning. He does take pain medicine at night and when he gets up. He is unsure if back pain affects his balance. He also has a PMH significant for impaired memory/cognition.    Limitations Standing;Walking    How long can you sit comfortably? NA, will have stiffness when he gets up after sitting for a while    How long can you stand comfortably? 10-15 min, limited by back pain and fatigue    How long can you walk comfortably? about 50 yards    Patient Stated Goals "get my balance back."    Currently in Pain? No/denies    Pain Onset More than a month ago           Neuromuscular Re-education  Rocker board fwd/bwd, side to side x 20 each direction Tandem stand on purple without UE support x 2 mins Side stepping on blue foam without UE support x 2 lengths Heel/toe raises without UE support 3s hold x 10 each 1/2 foam roll balance with flat side up 30s x 2 reps 1/2 foam roll balance with flat  side down 30s x 2 reps 1/2 foam roll tandem balance alternating forward LE 30s x 2 each LE forward Lateral side steps from foam to 6 inch stool left and right x 15 Backwards stepping from foam to 6 inch stool x 15  Lunge on bosu ball x 15 BLE Tapping on foam to bosu ball     Pt educated throughout session about proper posture and technique with exercises. Improved exercise technique, movement at target joints, use of target muscles after min to mod verbal, visual, tactile cues.                         PT Education - 10/17/20 0937    Education Details HEP    Person(s) Educated Patient    Methods Explanation    Comprehension Verbalized  understanding            PT Short Term Goals - 09/10/20 1019      PT SHORT TERM GOAL #1   Title Patient will be adherent to HEP at least 3x a week to improve functional strength and balance for better safety at home.    Time 4    Period Weeks    Status New    Target Date 10/08/20      PT SHORT TERM GOAL #2   Title Patient will improve balance as evidenced by being able to hold SLS for >5 sec without loss of balance to exhibit improved stance control and reduce fall risk.    Time 4    Period Weeks    Status New    Target Date 10/08/20             PT Long Term Goals - 09/10/20 1020      PT LONG TERM GOAL #1   Title Patient will increase Functional Gait Assessment score to >22/30 as to reduce fall risk and improve dynamic gait safety with community ambulation.    Time 6    Period Weeks    Status New    Target Date 10/22/20      PT LONG TERM GOAL #2   Title Patient will improve FOTO score to >60% to indicate improved functional mobility    Time 6    Period Weeks    Status New    Target Date 10/22/20      PT LONG TERM GOAL #3   Title Patient will be independent in walking on unlevel surfaces including ramp/curb negotiation without AD to improve safety in community.    Time 6    Period Weeks    Status New    Target Date 10/22/20                 Plan - 10/17/20 0939    Clinical Impression Statement Pt demonstrates increased postural sway when standing on uneven surface and requires // bars to steady, and demonstrates fatigue at end of set of exercises focused on strength and endurance.  Patient will continue to benefit from skilled PT for improved balance and strength.   Personal Factors and Comorbidities Age;Comorbidity 3+;Time since onset of injury/illness/exacerbation    Comorbidities HTN, orthostatic hypotension, memory deficits, sleep apnea, high fall risk, arthritis with chronic low back pain;    Examination-Activity Limitations Carry;Bend;Stairs;Stand     Examination-Participation Restrictions Church;Cleaning;Community Activity;Shop;Volunteer;Yard Work    Stability/Clinical Decision Making Stable/Uncomplicated    Rehab Potential Good    PT Frequency 2x / week    PT Duration 6 weeks  PT Treatment/Interventions Cryotherapy;Moist Heat;Gait training;Stair training;Functional mobility training;Therapeutic activities;Therapeutic exercise;Balance training;Neuromuscular re-education;Patient/family education;Energy conservation    PT Next Visit Plan address balance deficits    PT Home Exercise Plan no updates this session    Consulted and Agree with Plan of Care Patient           Patient will benefit from skilled therapeutic intervention in order to improve the following deficits and impairments:  Abnormal gait, Decreased balance, Decreased endurance, Decreased mobility, Difficulty walking, Decreased safety awareness, Decreased activity tolerance  Visit Diagnosis: Unsteadiness on feet  History of falling     Problem List Patient Active Problem List   Diagnosis Date Noted  . Lumbar radiculopathy 08/25/2016  . Lumbar stenosis with neurogenic claudication 07/21/2016    Ezekiel Ina, [place] DPT 10/17/2020, 9:42 AM  [place] [facility] MAIN [facility] SERVICES [address] [place], [place], [address] Phone: [phone]   Fax:  [phone]  Name: Adam Bradshaw MRN: [id number] Date of Birth: 1946-10-12

## 2020-10-21 ENCOUNTER — Ambulatory Visit: Payer: Medicare Other | Admitting: Physical Therapy

## 2020-10-21 ENCOUNTER — Encounter: Payer: Self-pay | Admitting: Physical Therapy

## 2020-10-21 ENCOUNTER — Other Ambulatory Visit: Payer: Self-pay

## 2020-10-21 DIAGNOSIS — R2681 Unsteadiness on feet: Secondary | ICD-10-CM

## 2020-10-21 DIAGNOSIS — Z9181 History of falling: Secondary | ICD-10-CM

## 2020-10-21 NOTE — Therapy (Signed)
[place] MAIN [facility] SERVICES [address] [place], [place], [address] Phone: [phone]   Fax:  [phone]  Physical Therapy Treatment Physical Therapy Progress Note   Dates of reporting period  09/10/20  to   10/21/20   Patient Details  Name: Adam Bradshaw MRN: [id number] Date of Birth: September 17, 1946 Referring Provider (PT): Dr. Manuella Ghazi   Encounter Date: 10/21/2020   PT End of Session - 10/21/20 1107    Visit Number 10    Number of Visits 21    Date for PT Re-Evaluation 11/18/20    Authorization Type medicare    PT Start Time 1102    PT Stop Time 1145    PT Time Calculation (min) 43 min    Equipment Utilized During Treatment Gait belt    Activity Tolerance Patient tolerated treatment well;No increased pain    Behavior During Therapy WFL for tasks assessed/performed           Past Medical History:  Diagnosis Date  . Arthritis   . Headache   . Hypertension   . Pre-diabetes     Past Surgical History:  Procedure Laterality Date  . APPENDECTOMY    . basal skin surgery x2    . CERVICAL DISC SURGERY    . HAND SURGERY Right   . LUMBAR LAMINECTOMY/DECOMPRESSION MICRODISCECTOMY N/A 07/21/2016   Procedure: Lumbar four-five Laminectomy;  Surgeon: Leeroy Cha, MD;  Location: [place] [facility];  Service: Neurosurgery;  Laterality: N/A;  . LUMBAR WOUND DEBRIDEMENT N/A 08/25/2016   Procedure: Exploration of lumbar wound;  Surgeon: Leeroy Cha, MD;  Location: [place] [facility];  Service: Neurosurgery;  Laterality: N/A;  Exploration of lumbar wound  . SHOULDER SURGERY Right     There were no vitals filed for this visit.   Subjective Assessment - 10/21/20 1106    Subjective Patient reports increased unsteadiness today with increased staggering. Denies any new falls. He reports having to hold onto objects more. He denies any pain;    Pertinent History 74 yo Male reports trouble with his balance over last year or so. He reports approximately 3-4  falls in last 6 months. He reports most falls occur when he trips over something or mis steps. He denies any numbness/tingling. He is not currently using a RW at this time. He reports using SPC sometimes especially when walking outside of home. He rarely uses assistive device inside home. He reports occasional dizziness/lightheadedness especially when getting up from chair. He denies any falls associated with dizziness. He does have a PMH significant for chronic low back pain. He reports pain is worse in the morning. He does take pain medicine at night and when he gets up. He is unsure if back pain affects his balance. He also has a PMH significant for impaired memory/cognition.    Limitations Standing;Walking    How long can you sit comfortably? NA, will have stiffness when he gets up after sitting for a while    How long can you stand comfortably? 10-15 min, limited by back pain and fatigue    How long can you walk comfortably? about 50 yards    Patient Stated Goals "get my balance back."    Currently in Pain? No/denies    Pain Onset More than a month ago    Multiple Pain Sites No           TREATMENT: Warm up on Crosstrainer level 2 x4 min (Unbilled);  BP at start of session 129/87, HR 60  Patient reports increased dizziness and staggering this session He presents to therapy with walking stick;  Instructed patient in outcome measures to address progress towards goals: Instructed patient in FOTO, Functional gait assessment, etc, see below:   SLS: 3 sec on each LE with immediate loss of balance laterally;    [facility] PT Assessment - 10/21/20 0001      Observation/Other Assessments   Focus on Therapeutic Outcomes (FOTO)  45.4%   more impaired from 09/10/20 which was 53%     High Level Balance   High Level Balance Comments SLS: 3 sec each LE with lateral loss of balance      Functional Gait  Assessment   Gait Level Surface Walks 20 ft in less than 5.5 sec, no assistive devices, good  speed, no evidence for imbalance, normal gait pattern, deviates no more than 6 in outside of the 12 in walkway width.    Change in Gait Speed Able to smoothly change walking speed without loss of balance or gait deviation. Deviate no more than 6 in outside of the 12 in walkway width.    Gait with Horizontal Head Turns Performs head turns smoothly with slight change in gait velocity (eg, minor disruption to smooth gait path), deviates 6-10 in outside 12 in walkway width, or uses an assistive device.    Gait with Vertical Head Turns Performs task with slight change in gait velocity (eg, minor disruption to smooth gait path), deviates 6 - 10 in outside 12 in walkway width or uses assistive device    Gait and Pivot Turn Pivot turns safely in greater than 3 sec and stops with no loss of balance, or pivot turns safely within 3 sec and stops with mild imbalance, requires small steps to catch balance.    Step Over Obstacle Is able to step over one shoe box (4.5 in total height) without changing gait speed. No evidence of imbalance.    Gait with Narrow Base of Support Ambulates less than 4 steps heel to toe or cannot perform without assistance.    Gait with Eyes Closed Walks 20 ft, uses assistive device, slower speed, mild gait deviations, deviates 6-10 in outside 12 in walkway width. Ambulates 20 ft in less than 9 sec but greater than 7 sec.    Ambulating Backwards Walks 20 ft, uses assistive device, slower speed, mild gait deviations, deviates 6-10 in outside 12 in walkway width.    Steps Alternating feet, must use rail.    Total Score 20    FGA comment: <23 indicates increased risk for falls   no change from initiale val on 09/10/20          BP following outcome measures: 128/67, HR 57  Advanced HEP with sit<>stand from chair with arms across chest x10 reps with good control and balance noted;  Patient's condition has the potential to improve in response to therapy. Maximum improvement is yet to be  obtained. The anticipated improvement is attainable and reasonable in a generally predictable time.  Patient reports feeling more unsteady, unsure if this is related to just starting a blood thinner or if something else is contributing to dizziness/staggering feeling;                        PT Education - 10/21/20 1106    Education Details HEP/progress towards goals;    Person(s) Educated Patient    Methods Explanation;Verbal cues    Comprehension Returned demonstration;Verbal cues required;Verbalized understanding;Need further  instruction            PT Short Term Goals - 10/21/20 1107      PT SHORT TERM GOAL #1   Title Patient will be adherent to HEP at least 3x a week to improve functional strength and balance for better safety at home.    Baseline 11/15: unable to do this last week from feeling unsteady;    Time 4    Period Weeks    Status Not Met    Target Date 10/08/20      PT SHORT TERM GOAL #2   Title Patient will improve balance as evidenced by being able to hold SLS for >5 sec without loss of balance to exhibit improved stance control and reduce fall risk.    Time 4    Period Weeks    Status New    Target Date 10/08/20             PT Long Term Goals - 10/21/20 1108      PT LONG TERM GOAL #1   Title Patient will increase Functional Gait Assessment score to >22/30 as to reduce fall risk and improve dynamic gait safety with community ambulation.    Baseline 10/5: 20/30, 11/15: 20/30    Time 4    Period Weeks    Status Not Met    Target Date 11/18/20      PT LONG TERM GOAL #2   Title Patient will improve FOTO score to >60% to indicate improved functional mobility    Baseline 11/15: 45.4%    Time 4    Period Weeks    Status Not Met    Target Date 11/18/20      PT LONG TERM GOAL #3   Title Patient will be independent in walking on unlevel surfaces including ramp/curb negotiation without AD to improve safety in community.    Baseline 11/15:  unable, requires walking stick or close supervision;    Time 4    Period Weeks    Status Not Met    Target Date 11/18/20                 Plan - 10/21/20 1135    Clinical Impression Statement Patient instructed in outcome measures to address goals. He reports increased dizziness over last week which has limited his ability to do HEP. He presents to therapy with walking stick. He was recently started on a blood thinner to help with dizziness but he reports he hasn't been feeling well since starting the blood thinner. He is scheduled for a stress test in the next week or so. Vitals monitored throughout session. He does exhibit increased unsteadiness with walking especially when negotiating obstacles/turning. Patient exhibits no significant improvement towards outcome measures. This could be related to increased dizziness and not being able to work on HEP at home due to unstable feeling. He would benefit from additional skilled PT intervention to improve strength, balance and mobility;    Personal Factors and Comorbidities Age;Comorbidity 3+;Time since onset of injury/illness/exacerbation    Comorbidities HTN, orthostatic hypotension, memory deficits, sleep apnea, high fall risk, arthritis with chronic low back pain;    Examination-Activity Limitations Carry;Bend;Stairs;Stand    Examination-Participation Restrictions Church;Cleaning;Community Activity;Shop;Volunteer;Yard Work    Stability/Clinical Decision Making Stable/Uncomplicated    Rehab Potential Good    PT Frequency 2x / week    PT Duration 4 weeks    PT Treatment/Interventions Cryotherapy;Moist Heat;Gait training;Stair training;Functional mobility training;Therapeutic activities;Therapeutic exercise;Balance training;Neuromuscular re-education;Patient/family education;Energy conservation  PT Next Visit Plan address balance deficits    PT Home Exercise Plan no updates this session    Consulted and Agree with Plan of Care Patient             Patient will benefit from skilled therapeutic intervention in order to improve the following deficits and impairments:  Abnormal gait, Decreased balance, Decreased endurance, Decreased mobility, Difficulty walking, Decreased safety awareness, Decreased activity tolerance  Visit Diagnosis: Unsteadiness on feet  History of falling     Problem List Patient Active Problem List   Diagnosis Date Noted  . Lumbar radiculopathy 08/25/2016  . 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 [place], [place], [address] Phone: [phone]   Fax:  [phone]  Name: Adam Bradshaw MRN: [id number] Date of Birth: [birth year]

## 2020-10-21 NOTE — Patient Instructions (Signed)
Access Code: [email or web address] URL: [email or web address] Date: 10/21/2020 Prepared by: Zettie Pho  Exercises Sit to Stand without Arm Support - 2 x daily - 7 x weekly - 1 sets - 10 reps

## 2020-10-23 ENCOUNTER — Other Ambulatory Visit: Payer: Self-pay

## 2020-10-23 ENCOUNTER — Ambulatory Visit: Payer: Medicare Other | Admitting: Physical Therapy

## 2020-10-23 ENCOUNTER — Encounter: Payer: Self-pay | Admitting: Physical Therapy

## 2020-10-23 DIAGNOSIS — R2681 Unsteadiness on feet: Secondary | ICD-10-CM | POA: Diagnosis not present

## 2020-10-23 DIAGNOSIS — Z9181 History of falling: Secondary | ICD-10-CM

## 2020-10-23 NOTE — Therapy (Signed)
[place] MAIN [facility] SERVICES [address] [place], [place], [address] Phone: [phone]   Fax:  [phone]  Physical Therapy Treatment  Patient Details  Name: Adam Bradshaw MRN: [id number] Date of Birth: 1945-12-29 Referring Provider (PT): Dr. Manuella Ghazi   Encounter Date: 10/23/2020   PT End of Session - 10/23/20 1107    Visit Number 11    Number of Visits 21    Date for PT Re-Evaluation 11/18/20    Authorization Type medicare    PT Start Time 1102    PT Stop Time 1145    PT Time Calculation (min) 43 min    Equipment Utilized During Treatment Gait belt    Activity Tolerance Patient tolerated treatment well;No increased pain    Behavior During Therapy WFL for tasks assessed/performed           Past Medical History:  Diagnosis Date  . Arthritis   . Headache   . Hypertension   . Pre-diabetes     Past Surgical History:  Procedure Laterality Date  . APPENDECTOMY    . basal skin surgery x2    . CERVICAL DISC SURGERY    . HAND SURGERY Right   . LUMBAR LAMINECTOMY/DECOMPRESSION MICRODISCECTOMY N/A 07/21/2016   Procedure: Lumbar four-five Laminectomy;  Surgeon: Leeroy Cha, MD;  Location: [place] NEUR[facility] ORS;  Service: Neurosurgery;  Laterality: N/A;  . LUMBAR WOUND DEBRIDEMENT N/A 08/25/2016   Procedure: Exploration of lumbar wound;  Surgeon: Leeroy Cha, MD;  Location: [place] NEURO ORS;  Service: Neurosurgery;  Laterality: N/A;  Exploration of lumbar wound  . SHOULDER SURGERY Right     There were no vitals filed for this visit.   Subjective Assessment - 10/23/20 1107    Subjective Patient reports he is still staggering at home. Denies any new falls;    Pertinent History 74 yo Male reports trouble with his balance over last year or so. He reports approximately 3-4 falls in last 6 months. He reports most falls occur when he trips over something or mis steps. He denies any numbness/tingling. He is not currently using a RW at this time. He  reports using SPC sometimes especially when walking outside of home. He rarely uses assistive device inside home. He reports occasional dizziness/lightheadedness especially when getting up from chair. He denies any falls associated with dizziness. He does have a PMH significant for chronic low back pain. He reports pain is worse in the morning. He does take pain medicine at night and when he gets up. He is unsure if back pain affects his balance. He also has a PMH significant for impaired memory/cognition.    Limitations Standing;Walking    How long can you sit comfortably? NA, will have stiffness when he gets up after sitting for a while    How long can you stand comfortably? 10-15 min, limited by back pain and fatigue    How long can you walk comfortably? about 50 yards    Patient Stated Goals "get my balance back."    Currently in Pain? No/denies    Pain Onset More than a month ago    Multiple Pain Sites No             TREATMENT:  Warm up on Crosstrainer level 2 x4 min (Unbilled);   Patient instructed in advanced balance exercise Standing on airex:  -feet apart eyes open 30 sec, eyes closed 10 sec x2 sets each unsupported;  -alternate toe taps to 8 inch step x15 reps  unsupported with CGA for safety;  -standing one foot on airex, one foot on 8 inch step:  Unsupported standing x15 sec each foot on step;              Unsupported head turns side/side x10 reps             Patient does require min A for safety with increased unsteadiness noted especially with lateral head turns;  -modified tandem stance   Unsupported standing 10 sec hold each foot in front;   with ball pass side/side x10 reps each foot in front;  Patient had increased difficulty standing with left foot in front of right foot requiring min A with positioning but was able to hold right foot in front of left foot with supervision;   Weaving around cones #6 x4 laps with close supervision with cues to increase step length  for walking around cones  Side stepping over cones #6 x2 laps each direction with min A for safety and cues to increase step length for better foot clearance;   Advanced HEP: Forward/backward walking x5 feet x5 laps each direction; Side stepping x5 feet x5 laps each direction Patient required min VCs for increased step length for better reciprocal gait pattern;  See patient instructions;   Patient tolerated session well. He did require min A with advanced balance exercise particularly when standing on foamwithout rail assist.He also required min VCS for weight shift and to increase step length for better balance when standing and negotiating obstacles. Denies any pain or fatigue at end of session; BP at end of session 115/72                          PT Education - 10/23/20 1107    Education Details HEP/balance/strengthening;    Person(s) Educated Patient    Methods Explanation;Verbal cues    Comprehension Verbalized understanding;Returned demonstration;Verbal cues required;Need further instruction            PT Short Term Goals - 10/21/20 1107      PT SHORT TERM GOAL #1   Title Patient will be adherent to HEP at least 3x a week to improve functional strength and balance for better safety at home.    Baseline 11/15: unable to do this last week from feeling unsteady;    Time 4    Period Weeks    Status Not Met    Target Date 10/08/20      PT SHORT TERM GOAL #2   Title Patient will improve balance as evidenced by being able to hold SLS for >5 sec without loss of balance to exhibit improved stance control and reduce fall risk.    Time 4    Period Weeks    Status New    Target Date 10/08/20             PT Long Term Goals - 10/21/20 1108      PT LONG TERM GOAL #1   Title Patient will increase Functional Gait Assessment score to >22/30 as to reduce fall risk and improve dynamic gait safety with community ambulation.    Baseline 10/5: 20/30,  11/15: 20/30    Time 4    Period Weeks    Status Not Met    Target Date 11/18/20      PT LONG TERM GOAL #2   Title Patient will improve FOTO score to >60% to indicate improved functional mobility    Baseline 11/15: 45.4%  Time 4    Period Weeks    Status Not Met    Target Date 11/18/20      PT LONG TERM GOAL #3   Title Patient will be independent in walking on unlevel surfaces including ramp/curb negotiation without AD to improve safety in community.    Baseline 11/15: unable, requires walking stick or close supervision;    Time 4    Period Weeks    Status Not Met    Target Date 11/18/20                 Plan - 10/23/20 1143    Clinical Impression Statement Patient instructed in advanced balance exercise. He continues to have increased difficulty standing on compliant surfaces with less rail assist especially with narrow base of support and lateral head turns. Patient does require min A for safety. Patient instructed in advanced balance exercise for HEP including forward/backward walking and side stepping. He verbalized understanding. He would benefit from additional skilled PT intervention to improve strength, balance and mobility;    Personal Factors and Comorbidities Age;Comorbidity 3+;Time since onset of injury/illness/exacerbation    Comorbidities HTN, orthostatic hypotension, memory deficits, sleep apnea, high fall risk, arthritis with chronic low back pain;    Examination-Activity Limitations Carry;Bend;Stairs;Stand    Examination-Participation Restrictions Church;Cleaning;Community Activity;Shop;Volunteer;Yard Work    Stability/Clinical Decision Making Stable/Uncomplicated    Rehab Potential Good    PT Frequency 2x / week    PT Duration 4 weeks    PT Treatment/Interventions Cryotherapy;Moist Heat;Gait training;Stair training;Functional mobility training;Therapeutic activities;Therapeutic exercise;Balance training;Neuromuscular re-education;Patient/family  education;Energy conservation    PT Next Visit Plan address balance deficits    PT Home Exercise Plan no updates this session    Consulted and Agree with Plan of Care Patient           Patient will benefit from skilled therapeutic intervention in order to improve the following deficits and impairments:  Abnormal gait, Decreased balance, Decreased endurance, Decreased mobility, Difficulty walking, Decreased safety awareness, Decreased activity tolerance  Visit Diagnosis: Unsteadiness on feet  History of falling     Problem List Patient Active Problem List   Diagnosis Date Noted  . Lumbar radiculopathy 08/25/2016  . Lumbar stenosis with neurogenic claudication 07/21/2016    Daryan Cagley PT, DPT 10/23/2020, 11:45 AM  [place] MAIN [facility] SERVICES [address] [place], [place], [address] Phone: [phone]   Fax:  [phone]  Name: Adam Bradshaw MRN: [id number] Date of Birth: 18-Jan-1946

## 2020-10-23 NOTE — Patient Instructions (Signed)
Access Code: [email or web address] URL: [email or web address] Date: 10/23/2020 Prepared by: Zettie Pho  Exercises Backward Walking with Counter Support - 1 x daily - 7 x weekly - 1 sets - 5 reps Side Stepping with Counter Support - 1 x daily - 7 x weekly - 1 sets - 5 reps

## 2020-10-28 ENCOUNTER — Ambulatory Visit: Payer: Medicare Other | Admitting: Physical Therapy

## 2020-10-28 ENCOUNTER — Other Ambulatory Visit: Payer: Self-pay

## 2020-10-28 ENCOUNTER — Encounter: Payer: Self-pay | Admitting: Physical Therapy

## 2020-10-28 DIAGNOSIS — R2681 Unsteadiness on feet: Secondary | ICD-10-CM | POA: Diagnosis not present

## 2020-10-28 DIAGNOSIS — Z9181 History of falling: Secondary | ICD-10-CM

## 2020-10-28 NOTE — Therapy (Signed)
[place] MAIN [facility] SERVICES [address] [place], [place], [address] Phone: [phone]   Fax:  [phone]  Physical Therapy Treatment  Patient Details  Name: Adam Bradshaw MRN: [id number] Date of Birth: 13-Jun-1946 Referring Provider (PT): Dr. Manuella Ghazi   Encounter Date: 10/28/2020   PT End of Session - 10/28/20 1112    Visit Number 12    Number of Visits 21    Date for PT Re-Evaluation 11/18/20    Authorization Type medicare    PT Start Time 1102    PT Stop Time 1145    PT Time Calculation (min) 43 min    Equipment Utilized During Treatment Gait belt    Activity Tolerance Patient tolerated treatment well;No increased pain    Behavior During Therapy WFL for tasks assessed/performed           Past Medical History:  Diagnosis Date  . Arthritis   . Headache   . Hypertension   . Pre-diabetes     Past Surgical History:  Procedure Laterality Date  . APPENDECTOMY    . basal skin surgery x2    . CERVICAL DISC SURGERY    . HAND SURGERY Right   . LUMBAR LAMINECTOMY/DECOMPRESSION MICRODISCECTOMY N/A 07/21/2016   Procedure: Lumbar four-five Laminectomy;  Surgeon: Leeroy Cha, MD;  Location: [place] [facility];  Service: Neurosurgery;  Laterality: N/A;  . LUMBAR WOUND DEBRIDEMENT N/A 08/25/2016   Procedure: Exploration of lumbar wound;  Surgeon: Leeroy Cha, MD;  Location: [place] [facility];  Service: Neurosurgery;  Laterality: N/A;  Exploration of lumbar wound  . SHOULDER SURGERY Right     There were no vitals filed for this visit.   Subjective Assessment - 10/28/20 1112    Subjective Patient reports still having dizziness feeling but reports otherwise is feeling okay. he denies any new falls;    Pertinent History 74 yo Male reports trouble with his balance over last year or so. He reports approximately 3-4 falls in last 6 months. He reports most falls occur when he trips over something or mis steps. He denies any numbness/tingling. He is not  currently using a RW at this time. He reports using SPC sometimes especially when walking outside of home. He rarely uses assistive device inside home. He reports occasional dizziness/lightheadedness especially when getting up from chair. He denies any falls associated with dizziness. He does have a PMH significant for chronic low back pain. He reports pain is worse in the morning. He does take pain medicine at night and when he gets up. He is unsure if back pain affects his balance. He also has a PMH significant for impaired memory/cognition.    Limitations Standing;Walking    How long can you sit comfortably? NA, will have stiffness when he gets up after sitting for a while    How long can you stand comfortably? 10-15 min, limited by back pain and fatigue    How long can you walk comfortably? about 50 yards    Patient Stated Goals "get my balance back."    Currently in Pain? No/denies    Pain Onset More than a month ago                 TREATMENT: Warm up onCrosstrainer level 2 x4 min (Unbilled);   Patient instructed in advanced balance exercise Standing on airex:  -feet apart eyes open 30 sec, eyes closed 10 sec x3 sets each unsupported;  -alternate toe taps to 8 inch step x15 reps  unsupported with CGA-close supervision for safety;  -standing one foot on airex, one foot on 8 inch step:             Unsupported standing x15 sec each foot on step;  Unsupported head turns side/side x5 reps          Unsupported standing with BUE ball pass side/side x10 reps each;  Patient does require min A for safety with increased unsteadiness noted especially with lateral head turns; However he does exhibit improvement with ball pass with less unsteadiness;   Standing on 1/2 bolster: -feet apart, heel/toe rock x15 reps with B rail assist -feet apart, BUE wand flexion x10 reps with feet in neutral, required min A for balance with cues for increased ankle strategies for  balance recovery -tandem stance with 2-0 rail assist 10 sec hold x3 reps each foot in front;  Stepping over orange hurdles #2 forward reciprocal without rail assist x10 reps with CGA to close supervision with cues for heel strike;  Gait in hallway: Forward walking with head turns side/side x50 feet x3 laps Backward walking with head turns side/side x50 feet x2 laps Patient did require CGA and cues for gaze stabilization for better dynamic balance mobility; Patient was able to exhibit less lateral instability with gaze stabilization compared to without. He denies any dizziness but does report that he staggers with head turns;  Patient tolerated session well. He did require min A with advanced balance exercise particularly when standing on 1/2 foamwithout rail assist.He also required min VCS for weight shift and to increase step length for better balance when standing and negotiating obstacles. Denies any pain or fatigue at end of session; Advanced HEP to include head turns with forward/backward walking;                         PT Education - 10/28/20 1112    Education Details balance/HEP, strengthening;    Person(s) Educated Patient    Methods Explanation;Verbal cues    Comprehension Verbalized understanding;Returned demonstration;Verbal cues required;Need further instruction            PT Short Term Goals - 10/21/20 1107      PT SHORT TERM GOAL #1   Title Patient will be adherent to HEP at least 3x a week to improve functional strength and balance for better safety at home.    Baseline 11/15: unable to do this last week from feeling unsteady;    Time 4    Period Weeks    Status Not Met    Target Date 10/08/20      PT SHORT TERM GOAL #2   Title Patient will improve balance as evidenced by being able to hold SLS for >5 sec without loss of balance to exhibit improved stance control and reduce fall risk.    Time 4    Period Weeks    Status New    Target  Date 10/08/20             PT Long Term Goals - 10/21/20 1108      PT LONG TERM GOAL #1   Title Patient will increase Functional Gait Assessment score to >22/30 as to reduce fall risk and improve dynamic gait safety with community ambulation.    Baseline 10/5: 20/30, 11/15: 20/30    Time 4    Period Weeks    Status Not Met    Target Date 11/18/20      PT LONG TERM  GOAL #2   Title Patient will improve FOTO score to >60% to indicate improved functional mobility    Baseline 11/15: 45.4%    Time 4    Period Weeks    Status Not Met    Target Date 11/18/20      PT LONG TERM GOAL #3   Title Patient will be independent in walking on unlevel surfaces including ramp/curb negotiation without AD to improve safety in community.    Baseline 11/15: unable, requires walking stick or close supervision;    Time 4    Period Weeks    Status Not Met    Target Date 11/18/20                 Plan - 10/28/20 1349    Clinical Impression Statement Patient instructed in advanced balance exercise. He exhibits decreased ankle strategies having difficulty standing on compliant surfaces such as 1/2 bolster. Patient did require min A while on complaint surfaces for weight shift and balance control. He does exhibit better stance control with staggered stance and ball pass. however when turning head side/side exhibits loss of balance laterally. Patient instructed in forward/backward walking with lateral head turns. He initially had difficulty veering side/side. However with cues for gaze stabilization was able to exhibit better dynamic balance control. Recommended patient add head turns with forward/backward walking as part of HEP. He would benefit from additional skilled PT Intervention to improve strength, balance and mobility;    Personal Factors and Comorbidities Age;Comorbidity 3+;Time since onset of injury/illness/exacerbation    Comorbidities HTN, orthostatic hypotension, memory deficits, sleep  apnea, high fall risk, arthritis with chronic low back pain;    Examination-Activity Limitations Carry;Bend;Stairs;Stand    Examination-Participation Restrictions Church;Cleaning;Community Activity;Shop;Volunteer;Yard Work    Stability/Clinical Decision Making Stable/Uncomplicated    Rehab Potential Good    PT Frequency 2x / week    PT Duration 4 weeks    PT Treatment/Interventions Cryotherapy;Moist Heat;Gait training;Stair training;Functional mobility training;Therapeutic activities;Therapeutic exercise;Balance training;Neuromuscular re-education;Patient/family education;Energy conservation    PT Next Visit Plan address balance deficits    PT Home Exercise Plan no updates this session    Consulted and Agree with Plan of Care Patient           Patient will benefit from skilled therapeutic intervention in order to improve the following deficits and impairments:  Abnormal gait, Decreased balance, Decreased endurance, Decreased mobility, Difficulty walking, Decreased safety awareness, Decreased activity tolerance  Visit Diagnosis: Unsteadiness on feet  History of falling     Problem List Patient Active Problem List   Diagnosis Date Noted  . Lumbar radiculopathy 08/25/2016  . 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

## 2020-10-30 ENCOUNTER — Ambulatory Visit: Payer: Medicare Other | Admitting: Physical Therapy

## 2020-10-30 ENCOUNTER — Encounter: Payer: Self-pay | Admitting: Physical Therapy

## 2020-10-30 ENCOUNTER — Other Ambulatory Visit: Payer: Self-pay

## 2020-10-30 DIAGNOSIS — R2681 Unsteadiness on feet: Secondary | ICD-10-CM | POA: Diagnosis not present

## 2020-10-30 DIAGNOSIS — Z9181 History of falling: Secondary | ICD-10-CM

## 2020-10-30 NOTE — Therapy (Signed)
[place] MAIN [facility] SERVICES [address] [place], [place], [address] Phone: [phone]   Fax:  [phone]  Physical Therapy Treatment  Patient Details  Name: Adam Bradshaw MRN: [id number] Date of Birth: [birth year] Referring Provider (PT): Dr. Manuella Ghazi   Encounter Date: 10/30/2020   PT End of Session - 10/30/20 1116    Visit Number 13    Number of Visits 21    Date for PT Re-Evaluation 11/18/20    Authorization Type medicare    PT Start Time 1102    PT Stop Time 1145    PT Time Calculation (min) 43 min    Equipment Utilized During Treatment Gait belt    Activity Tolerance Patient tolerated treatment well;No increased pain    Behavior During Therapy WFL for tasks assessed/performed           Past Medical History:  Diagnosis Date   Arthritis    Headache    Hypertension    Pre-diabetes     Past Surgical History:  Procedure Laterality Date   APPENDECTOMY     basal skin surgery x2     CERVICAL DISC SURGERY     HAND SURGERY Right    LUMBAR LAMINECTOMY/DECOMPRESSION MICRODISCECTOMY N/A 07/21/2016   Procedure: Lumbar four-five Laminectomy;  Surgeon: Leeroy Cha, MD;  Location: [facility];  Service: Neurosurgery;  Laterality: N/A;   LUMBAR WOUND DEBRIDEMENT N/A 08/25/2016   Procedure: Exploration of lumbar wound;  Surgeon: Leeroy Cha, MD;  Location: [place] [facility];  Service: Neurosurgery;  Laterality: N/A;  Exploration of lumbar wound   SHOULDER SURGERY Right     There were no vitals filed for this visit.   Subjective Assessment - 10/30/20 1110    Subjective Patient reports doing better. He presents to therapy without SPC;    Pertinent History 74 yo Male reports trouble with his balance over last year or so. He reports approximately 3-4 falls in last 6 months. He reports most falls occur when he trips over something or mis steps. He denies any numbness/tingling. He is not currently using a RW at this time. He  reports using SPC sometimes especially when walking outside of home. He rarely uses assistive device inside home. He reports occasional dizziness/lightheadedness especially when getting up from chair. He denies any falls associated with dizziness. He does have a PMH significant for chronic low back pain. He reports pain is worse in the morning. He does take pain medicine at night and when he gets up. He is unsure if back pain affects his balance. He also has a PMH significant for impaired memory/cognition.    Limitations Standing;Walking    How long can you sit comfortably? NA, will have stiffness when he gets up after sitting for a while    How long can you stand comfortably? 10-15 min, limited by back pain and fatigue    How long can you walk comfortably? about 50 yards    Patient Stated Goals "get my balance back."    Currently in Pain? No/denies    Pain Onset More than a month ago               TREATMENT: Warm up onCrosstrainerlevel 2 x4 min (Unbilled);   Patient instructed in advanced balance exercise  Standing on airex beam: -side stepping x3 laps each direction without rail assist, close supervision; -tandem stance with ball pass side/side #3, x4 reps each foot in front; Required min A for safety;   Standing on  airex: -feet apart eyes open 30 sec, eyes closed 10 sec x3 sets each unsupported; -standing one foot on airex, one foot on 8 inch step: Unsupported standing x15 sec each foot on step; Unsupported head turns side/side x5reps             Unsupported head turns up/down x5 reps;   Patient does require min A for safety; He does exhibit less unsteadiness compared to previous sessions but does shake a little;   Weaving around cones #5 x2 laps, exhibits good safety awareness and no instability;   Side stepping over cones #5 x1 lap each direction, required min A for safety and cues to increase step length for better foot  clearance;  Standing on firm surface, alternate toe taps on cone x10 reps each LE with min A for safety; Patient does exhibit unsteadiness but was able to catch self;   Gait in hallway: Forward walking with head turns side/side x70 feet x2 laps Backward walking with head turns side/side x70 feet x1 laps Forward walking with eyes closed x30 feet x3 sets with veering to right noted, CGA for safety;  Patient did require CGA and cues for gaze stabilization for better dynamic balance mobility; Patient was able to exhibit less lateral instability with gaze stabilization compared to without. He denies any dizziness but does report that he staggers with head turns;  Patient tolerated session well. He did require min A with advanced balance exercise particularly when standing with narrow base of supportwithout rail assist.He also required min VCS for weight shift and to increase step length for better balance when standing and negotiating obstacles. Denies any pain or fatigue at end of session;                      PT Education - 10/30/20 1116    Education Details balance/HEP, strengthening;    Person(s) Educated Patient    Methods Explanation;Verbal cues    Comprehension Verbalized understanding;Returned demonstration;Verbal cues required;Need further instruction            PT Short Term Goals - 10/21/20 1107      PT SHORT TERM GOAL #1   Title Patient will be adherent to HEP at least 3x a week to improve functional strength and balance for better safety at home.    Baseline 11/15: unable to do this last week from feeling unsteady;    Time 4    Period Weeks    Status Not Met    Target Date 10/08/20      PT SHORT TERM GOAL #2   Title Patient will improve balance as evidenced by being able to hold SLS for >5 sec without loss of balance to exhibit improved stance control and reduce fall risk.    Time 4    Period Weeks    Status New    Target Date 10/08/20              PT Long Term Goals - 10/21/20 1108      PT LONG TERM GOAL #1   Title Patient will increase Functional Gait Assessment score to >22/30 as to reduce fall risk and improve dynamic gait safety with community ambulation.    Baseline 10/5: 20/30, 11/15: 20/30    Time 4    Period Weeks    Status Not Met    Target Date 11/18/20      PT LONG TERM GOAL #2   Title Patient will improve FOTO score to >60%  to indicate improved functional mobility    Baseline 11/15: 45.4%    Time 4    Period Weeks    Status Not Met    Target Date 11/18/20      PT LONG TERM GOAL #3   Title Patient will be independent in walking on unlevel surfaces including ramp/curb negotiation without AD to improve safety in community.    Baseline 11/15: unable, requires walking stick or close supervision;    Time 4    Period Weeks    Status Not Met    Target Date 11/18/20                 Plan - 10/30/20 1210    Clinical Impression Statement Patient motivated and participated well within session. Focused on balance exercise utilizing compliant surface to challenge stance control. Patient does exhibit less unsteadiness with lateral head turns this session compared to previous session. Patient does continue to have trouble with narrow base of support. Patient did require CGA to min A for safety. Patient would benefit from additional skilled PT Intervention to improve strength, balance and gait safety;    Personal Factors and Comorbidities Age;Comorbidity 3+;Time since onset of injury/illness/exacerbation    Comorbidities HTN, orthostatic hypotension, memory deficits, sleep apnea, high fall risk, arthritis with chronic low back pain;    Examination-Activity Limitations Carry;Bend;Stairs;Stand    Examination-Participation Restrictions Church;Cleaning;Community Activity;Shop;Volunteer;Yard Work    Stability/Clinical Decision Making Stable/Uncomplicated    Rehab Potential Good    PT Frequency 2x / week    PT Duration  4 weeks    PT Treatment/Interventions Cryotherapy;Moist Heat;Gait training;Stair training;Functional mobility training;Therapeutic activities;Therapeutic exercise;Balance training;Neuromuscular re-education;Patient/family education;Energy conservation    PT Next Visit Plan address balance deficits    PT Home Exercise Plan no updates this session    Consulted and Agree with Plan of Care Patient           Patient will benefit from skilled therapeutic intervention in order to improve the following deficits and impairments:  Abnormal gait, Decreased balance, Decreased endurance, Decreased mobility, Difficulty walking, Decreased safety awareness, Decreased activity tolerance  Visit Diagnosis: Unsteadiness on feet  History of falling     Problem List Patient Active Problem List   Diagnosis Date Noted   Lumbar radiculopathy 08/25/2016   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 [place], [place], [address] Phone: [phone]   Fax:  [phone]  Name: Adam Bradshaw MRN: [id number] Date of Birth: [birth year]

## 2020-11-04 ENCOUNTER — Ambulatory Visit: Payer: Medicare Other | Admitting: Physical Therapy

## 2020-11-06 ENCOUNTER — Other Ambulatory Visit: Payer: Self-pay

## 2020-11-06 ENCOUNTER — Encounter: Payer: Self-pay | Admitting: Physical Therapy

## 2020-11-06 ENCOUNTER — Ambulatory Visit: Payer: Medicare Other | Attending: Neurology | Admitting: Physical Therapy

## 2020-11-06 DIAGNOSIS — R2681 Unsteadiness on feet: Secondary | ICD-10-CM | POA: Diagnosis not present

## 2020-11-06 DIAGNOSIS — Z9181 History of falling: Secondary | ICD-10-CM | POA: Insufficient documentation

## 2020-11-06 NOTE — Therapy (Signed)
[place] [facility] SERVICES [address] [place], [place], [address] Phone: [phone]   Fax:  [phone]  Physical Therapy Treatment  Patient Details  Name: Adam Bradshaw MRN: [id number] Date of Birth: 06/20/1946 Referring Provider (PT): Dr. Manuella Ghazi   Encounter Date: 11/06/2020   PT End of Session - 11/06/20 1151    Visit Number 14    Number of Visits 21    Date for PT Re-Evaluation 11/18/20    Authorization Type medicare    PT Start Time 1100    PT Stop Time 1145    PT Time Calculation (min) 45 min    Equipment Utilized During Treatment Gait belt    Activity Tolerance Patient tolerated treatment well;No increased pain    Behavior During Therapy WFL for tasks assessed/performed           Past Medical History:  Diagnosis Date   Arthritis    Headache    Hypertension    Pre-diabetes     Past Surgical History:  Procedure Laterality Date   APPENDECTOMY     basal skin surgery x2     CERVICAL DISC SURGERY     HAND SURGERY Right    LUMBAR LAMINECTOMY/DECOMPRESSION MICRODISCECTOMY N/A 07/21/2016   Procedure: Lumbar four-five Laminectomy;  Surgeon: Leeroy Cha, MD;  Location: [facility];  Service: Neurosurgery;  Laterality: N/A;   LUMBAR WOUND DEBRIDEMENT N/A 08/25/2016   Procedure: Exploration of lumbar wound;  Surgeon: Leeroy Cha, MD;  Location: [place] [facility];  Service: Neurosurgery;  Laterality: N/A;  Exploration of lumbar wound   SHOULDER SURGERY Right     There were no vitals filed for this visit.   Subjective Assessment - 11/06/20 1150    Subjective Patient reports he went hunting over the weekend and did not have any falls. He states he went in for a stress test yesterday and he is very tired from it.    Pertinent History 74 yo Male reports trouble with his balance over last year or so. He reports approximately 3-4 falls in last 6 months. He reports most falls occur when he trips over something or mis  steps. He denies any numbness/tingling. He is not currently using a RW at this time. He reports using SPC sometimes especially when walking outside of home. He rarely uses assistive device inside home. He reports occasional dizziness/lightheadedness especially when getting up from chair. He denies any falls associated with dizziness. He does have a PMH significant for chronic low back pain. He reports pain is worse in the morning. He does take pain medicine at night and when he gets up. He is unsure if back pain affects his balance. He also has a PMH significant for impaired memory/cognition.    Limitations Standing;Walking    How long can you sit comfortably? NA, will have stiffness when he gets up after sitting for a while    How long can you stand comfortably? 10-15 min, limited by back pain and fatigue    How long can you walk comfortably? about 50 yards    Patient Stated Goals "get my balance back."    Currently in Pain? Yes    Pain Score 3     Pain Location Hip    Pain Orientation Right;Left    Pain Descriptors / Indicators Aching    Pain Type Chronic pain    Pain Onset More than a month ago          TREATMENT: Warm up onCrosstrainerlevel  2 x4 min (Unbilled);   Patient instructed in advanced balance exercise  Standing on airex beam: -side stepping x3 laps each direction without rail assist, close supervision; -tandem stance with ball pass side/side #3, x4 reps each foot in front; Required min A for safety;   Standing on airex: -feet apart eyes open 30 sec, eyes closed 10 sec x3 sets each unsupported; -standing one foot on airex, one foot on 8 inch step: Unsupported standing x15 sec each foot on step; Unsupported head turns side/side x5reps             Unsupported head turns up/down x5 reps;     Weaving around cones #5 x2 laps, exhibits good safety awareness and no instability;   Side stepping over cones #5 x1 lap each direction,  required min A for safety and cues to increase step length for better foot clearance;  Standing on firm surface, alternate toe taps on cone x10 reps each LE with min A for safety; Patient does exhibit unsteadiness but was able to catch self;   Sit to stands with 2kg medball x 2 sets of 10 reps  Gait in hallway: Forward walking with head turns side/side x70 feet x2 laps Backward walking with head turns side/side x70 feet x1 laps Forward walking with eyes closed x30 feet x3 sets with veering to right noted, CGA for safety;     Patient did require CGA and cues for gaze stabilization for better dynamic balance mobility; Patient was able to exhibit less lateral instability with gaze stabilization compared to without. He denies any dizziness but does report that he staggers with head turns;                                       PT Short Term Goals - 10/21/20 1107      PT SHORT TERM GOAL #1   Title Patient will be adherent to HEP at least 3x a week to improve functional strength and balance for better safety at home.    Baseline 11/15: unable to do this last week from feeling unsteady;    Time 4    Period Weeks    Status Not Met    Target Date 10/08/20      PT SHORT TERM GOAL #2   Title Patient will improve balance as evidenced by being able to hold SLS for >5 sec without loss of balance to exhibit improved stance control and reduce fall risk.    Time 4    Period Weeks    Status New    Target Date 10/08/20             PT Long Term Goals - 10/21/20 1108      PT LONG TERM GOAL #1   Title Patient will increase Functional Gait Assessment score to >22/30 as to reduce fall risk and improve dynamic gait safety with community ambulation.    Baseline 10/5: 20/30, 11/15: 20/30    Time 4    Period Weeks    Status Not Met    Target Date 11/18/20      PT LONG TERM GOAL #2   Title Patient will improve FOTO score to >60% to indicate improved functional  mobility    Baseline 11/15: 45.4%    Time 4    Period Weeks    Status Not Met    Target Date 11/18/20  PT LONG TERM GOAL #3   Title Patient will be independent in walking on unlevel surfaces including ramp/curb negotiation without AD to improve safety in community.    Baseline 11/15: unable, requires walking stick or close supervision;    Time 4    Period Weeks    Status Not Met    Target Date 11/18/20                 Plan - 11/06/20 1200    Clinical Impression Statement Maintained POC and balance exercises with fair tolerance to activity today. Patient demonstrated impulsivity and decreased safety awareness during tandem balance exercises. Therapist provided constant CGA/minA with balance exercises today due to decreased proprioception cues and balance deficits today. He demonstrates decreased hip flexion on LLE with stepping over obstacles and requires constant verbal cues for appropriate body mechanics. Will continue to focus on improving balance skills to decrease risk of falls and improve patient's quality of life.    Personal Factors and Comorbidities Age;Comorbidity 3+;Time since onset of injury/illness/exacerbation    Comorbidities HTN, orthostatic hypotension, memory deficits, sleep apnea, high fall risk, arthritis with chronic low back pain;    Examination-Activity Limitations Carry;Bend;Stairs;Stand    Examination-Participation Restrictions Church;Cleaning;Community Activity;Shop;Volunteer;Yard Work    Stability/Clinical Decision Making Stable/Uncomplicated    Rehab Potential Good    PT Frequency 2x / week    PT Duration 4 weeks    PT Treatment/Interventions Cryotherapy;Moist Heat;Gait training;Stair training;Functional mobility training;Therapeutic activities;Therapeutic exercise;Balance training;Neuromuscular re-education;Patient/family education;Energy conservation    PT Next Visit Plan address balance deficits    PT Home Exercise Plan no updates this session     Consulted and Agree with Plan of Care Patient           Patient will benefit from skilled therapeutic intervention in order to improve the following deficits and impairments:  Abnormal gait, Decreased balance, Decreased endurance, Decreased mobility, Difficulty walking, Decreased safety awareness, Decreased activity tolerance  Visit Diagnosis: Unsteadiness on feet  History of falling     Problem List Patient Active Problem List   Diagnosis Date Noted   Lumbar radiculopathy 08/25/2016   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

## 2020-11-13 ENCOUNTER — Encounter: Payer: Self-pay | Admitting: Physical Therapy

## 2020-11-13 ENCOUNTER — Other Ambulatory Visit: Payer: Self-pay

## 2020-11-13 ENCOUNTER — Ambulatory Visit: Payer: Medicare Other | Admitting: Physical Therapy

## 2020-11-13 DIAGNOSIS — R2681 Unsteadiness on feet: Secondary | ICD-10-CM | POA: Diagnosis not present

## 2020-11-13 DIAGNOSIS — Z9181 History of falling: Secondary | ICD-10-CM

## 2020-11-13 NOTE — Therapy (Signed)
[place] MAIN [facility] SERVICES [address] [place], [place], [address] Phone: [phone]   Fax:  [phone]  Physical Therapy Treatment  Patient Details  Name: Adam Bradshaw MRN: [id number] Date of Birth: May 04, 1946 Referring Provider (PT): Dr. Manuella Ghazi   Encounter Date: 11/13/2020   PT End of Session - 11/13/20 1026    Visit Number 15    Number of Visits 21    Date for PT Re-Evaluation 11/18/20    Authorization Type medicare    PT Start Time 1017    PT Stop Time 1100    PT Time Calculation (min) 43 min    Equipment Utilized During Treatment Gait belt    Activity Tolerance Patient tolerated treatment well;No increased pain    Behavior During Therapy WFL for tasks assessed/performed           Past Medical History:  Diagnosis Date  . Arthritis   . Headache   . Hypertension   . Pre-diabetes     Past Surgical History:  Procedure Laterality Date  . APPENDECTOMY    . basal skin surgery x2    . CERVICAL DISC SURGERY    . HAND SURGERY Right   . LUMBAR LAMINECTOMY/DECOMPRESSION MICRODISCECTOMY N/A 07/21/2016   Procedure: Lumbar four-five Laminectomy;  Surgeon: Leeroy Cha, MD;  Location: [place] [facility];  Service: Neurosurgery;  Laterality: N/A;  . LUMBAR WOUND DEBRIDEMENT N/A 08/25/2016   Procedure: Exploration of lumbar wound;  Surgeon: Leeroy Cha, MD;  Location: [place] [facility];  Service: Neurosurgery;  Laterality: N/A;  Exploration of lumbar wound  . SHOULDER SURGERY Right     There were no vitals filed for this visit.   Subjective Assessment - 11/13/20 1025    Subjective Patient reports doing well today. He denies any current pain today; Denies any new falls; He reports still staggering    Pertinent History 74 yo Male reports trouble with his balance over last year or so. He reports approximately 3-4 falls in last 6 months. He reports most falls occur when he trips over something or mis steps. He denies any numbness/tingling. He  is not currently using a RW at this time. He reports using SPC sometimes especially when walking outside of home. He rarely uses assistive device inside home. He reports occasional dizziness/lightheadedness especially when getting up from chair. He denies any falls associated with dizziness. He does have a PMH significant for chronic low back pain. He reports pain is worse in the morning. He does take pain medicine at night and when he gets up. He is unsure if back pain affects his balance. He also has a PMH significant for impaired memory/cognition.    Limitations Standing;Walking    How long can you sit comfortably? NA, will have stiffness when he gets up after sitting for a while    How long can you stand comfortably? 10-15 min, limited by back pain and fatigue    How long can you walk comfortably? about 50 yards    Patient Stated Goals "get my balance back."    Currently in Pain? No/denies    Pain Onset More than a month ago    Multiple Pain Sites No                TREATMENT: Warm up onCrosstrainerlevel 2 x4 min (Unbilled);   Patient instructed in advanced balance exercise SLS on firm surface 3-5 sec each LE with instability noted;   Standing on airex: -feet apart eyes open 30  sec, eyes closed 10 sec x3sets each unsupported; -standing one foot on airex, one foot on 8 inch step: Unsupported standing x15 sec each foot on step; Unsupported BUE ball pass side/side x10 reps each foot on step; Unsupported head turns up/down with BUE reaching overhead x10 reps each foot on step;    Ladder drills: Forward reciprocal gait x2 laps Forward step with contralateral leg lift (SLS) 3 sec hold x2 laps, min A for safety and cues to improve core stabilization for better stance control Out-out-in-in x2 laps with min VCs for sequencing and positioning;  Side stepping x1 lap each direction;   Gait in hallway:  Forward walking with ball  bounce and catch x70 feet x1 laps   Side stepping with ball toss and catch x30 feet each direction   [place] crossover step x30 feet x1 lap each direction;    Gait outside: Up/down curb x3 reps independently good safety and awareness and no instability noted; Walking up/down ramp x1 rep Progressed to walking up/down ramp with lateral head turns x1 rep Able to negotiate ramp well, no instability noted with regular walking, did have mild instability with head turns requiring close supervision but otherwise able to keep balance well;   Patient did require CGA to min A with SLS tasks this session. He was able to exhibit better dynamic balance control with less staggering with gait tasks/dual tasks. Patient denies any soreness or fatigue at end of session. He reports adherence with HEP;                            PT Education - 11/13/20 1025    Education Details balance/HEP, strengthening;    Person(s) Educated Patient    Methods Explanation;Verbal cues    Comprehension Verbalized understanding;Returned demonstration;Verbal cues required;Need further instruction            PT Short Term Goals - 10/21/20 1107      PT SHORT TERM GOAL #1   Title Patient will be adherent to HEP at least 3x a week to improve functional strength and balance for better safety at home.    Baseline 11/15: unable to do this last week from feeling unsteady;    Time 4    Period Weeks    Status Not Met    Target Date 10/08/20      PT SHORT TERM GOAL #2   Title Patient will improve balance as evidenced by being able to hold SLS for >5 sec without loss of balance to exhibit improved stance control and reduce fall risk.    Time 4    Period Weeks    Status New    Target Date 10/08/20             PT Long Term Goals - 10/21/20 1108      PT LONG TERM GOAL #1   Title Patient will increase Functional Gait Assessment score to >22/30 as to reduce fall risk and improve dynamic gait safety  with community ambulation.    Baseline 10/5: 20/30, 11/15: 20/30    Time 4    Period Weeks    Status Not Met    Target Date 11/18/20      PT LONG TERM GOAL #2   Title Patient will improve FOTO score to >60% to indicate improved functional mobility    Baseline 11/15: 45.4%    Time 4    Period Weeks    Status Not Met      Target Date 11/18/20      PT LONG TERM GOAL #3   Title Patient will be independent in walking on unlevel surfaces including ramp/curb negotiation without AD to improve safety in community.    Baseline 11/15: unable, requires walking stick or close supervision;    Time 4    Period Weeks    Status Not Met    Target Date 11/18/20                 Plan - 11/13/20 1105    Clinical Impression Statement Patient instructed in advanced  balance exercise. Patient continues to have difficulty with SLS tasks requiring CGA to min A with increased instability after 3-5 sec. Patient instructed in dual tasks/coordination tasks to challenge dynamic balance control. He exhibits less unsteadiness and less staggering this session. Patient was able to negotiate curb/ramp well without any difficulty. He would benefit from additional skilled PT Intervention to improve strength, balance and mobility;    Personal Factors and Comorbidities Age;Comorbidity 3+;Time since onset of injury/illness/exacerbation    Comorbidities HTN, orthostatic hypotension, memory deficits, sleep apnea, high fall risk, arthritis with chronic low back pain;    Examination-Activity Limitations Carry;Bend;Stairs;Stand    Examination-Participation Restrictions Church;Cleaning;Community Activity;Shop;Volunteer;Yard Work    Stability/Clinical Decision Making Stable/Uncomplicated    Rehab Potential Good    PT Frequency 2x / week    PT Duration 4 weeks    PT Treatment/Interventions Cryotherapy;Moist Heat;Gait training;Stair training;Functional mobility training;Therapeutic activities;Therapeutic exercise;Balance  training;Neuromuscular re-education;Patient/family education;Energy conservation    PT Next Visit Plan address balance deficits    PT Home Exercise Plan no updates this session    Consulted and Agree with Plan of Care Patient           Patient will benefit from skilled therapeutic intervention in order to improve the following deficits and impairments:  Abnormal gait, Decreased balance, Decreased endurance, Decreased mobility, Difficulty walking, Decreased safety awareness, Decreased activity tolerance  Visit Diagnosis: Unsteadiness on feet  History of falling     Problem List Patient Active Problem List   Diagnosis Date Noted  . Lumbar radiculopathy 08/25/2016  . 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   

## 2020-11-18 ENCOUNTER — Encounter: Payer: Self-pay | Admitting: Physical Therapy

## 2020-11-18 ENCOUNTER — Ambulatory Visit: Payer: Medicare Other | Admitting: Physical Therapy

## 2020-11-18 ENCOUNTER — Other Ambulatory Visit: Payer: Self-pay

## 2020-11-18 DIAGNOSIS — R2681 Unsteadiness on feet: Secondary | ICD-10-CM | POA: Diagnosis not present

## 2020-11-18 DIAGNOSIS — Z9181 History of falling: Secondary | ICD-10-CM

## 2020-11-18 NOTE — Therapy (Signed)
[place] MAIN [facility] SERVICES [address] [place], [place], [address] Phone: [phone]   Fax:  [phone]  Physical Therapy Treatment  Patient Details  Name: Adam Bradshaw MRN: [id number] Date of Birth: May 08, 1946 Referring Provider (PT): Dr. Manuella Ghazi   Encounter Date: 11/18/2020   PT End of Session - 11/18/20 0932    Visit Number 16    Number of Visits 21    Date for PT Re-Evaluation 11/18/20    Authorization Type medicare    PT Start Time 0932    PT Stop Time 1015    PT Time Calculation (min) 43 min    Equipment Utilized During Treatment Gait belt    Activity Tolerance Patient tolerated treatment well;No increased pain    Behavior During Therapy WFL for tasks assessed/performed           Past Medical History:  Diagnosis Date  . Arthritis   . Headache   . Hypertension   . Pre-diabetes     Past Surgical History:  Procedure Laterality Date  . APPENDECTOMY    . basal skin surgery x2    . CERVICAL DISC SURGERY    . HAND SURGERY Right   . LUMBAR LAMINECTOMY/DECOMPRESSION MICRODISCECTOMY N/A 07/21/2016   Procedure: Lumbar four-five Laminectomy;  Surgeon: Leeroy Cha, MD;  Location: [facility];  Service: Neurosurgery;  Laterality: N/A;  . LUMBAR WOUND DEBRIDEMENT N/A 08/25/2016   Procedure: Exploration of lumbar wound;  Surgeon: Leeroy Cha, MD;  Location: [facility];  Service: Neurosurgery;  Laterality: N/A;  Exploration of lumbar wound  . SHOULDER SURGERY Right     There were no vitals filed for this visit.   Subjective Assessment - 11/18/20 0931    Subjective Patient reports having a good weekend. He reports he is still staggering especially when walking outside. Denies any new falls;    Pertinent History 74 yo Male reports trouble with his balance over last year or so. He reports approximately 3-4 falls in last 6 months. He reports most falls occur when he trips over something or mis steps. He denies any  numbness/tingling. He is not currently using a RW at this time. He reports using SPC sometimes especially when walking outside of home. He rarely uses assistive device inside home. He reports occasional dizziness/lightheadedness especially when getting up from chair. He denies any falls associated with dizziness. He does have a PMH significant for chronic low back pain. He reports pain is worse in the morning. He does take pain medicine at night and when he gets up. He is unsure if back pain affects his balance. He also has a PMH significant for impaired memory/cognition.    Limitations Standing;Walking    How long can you sit comfortably? NA, will have stiffness when he gets up after sitting for a while    How long can you stand comfortably? 10-15 min, limited by back pain and fatigue    How long can you walk comfortably? about 50 yards    Patient Stated Goals "get my balance back."    Currently in Pain? No/denies    Pain Onset More than a month ago                  TREATMENT: Warm up onCrosstrainerlevel 2 x4 min (Unbilled);   Standing on airex: -feet together eyes open 30 sec, eyes closed 15 sec x2sets each unsupported; -standing one foot on airex, one foot on 8 inch step: Unsupported standing x15 sec  each foot on step; Unsupported BUE ball pass side/side x10 reps each foot on step; Unsupported ball toss and catch x5 reps, supervision with good stance control;  -alternate toe taps on 4 inch step, no rail assist x15 reps with CGA for safety, exhibits good weight shift;    Resisted walking: 12.5# forward/backward, side/side x3 laps each direction (4 way) with CGA for safety with min VCs to slow down eccentric return for better gait safety;   SLS on firm surface: Ball toss and catch against wall x10 reps each LE with min A for safety; Patient exhibits less instability while on firm surface compared to foam surface but is still  unsteady with SLS;   SLS on airex pad:  Tossing ball against wall and catching ball x10 reps each LE with min-mod A for balance control; Patient exhibit increased ankle instability requiring increased hip strategies for stance control often losing balance quickly;    Gait in gym:  Tandem gait forward/backward on line on floor x20 feet x2 laps each with min A for safety; Patient able to exhibit good foot placement with initial lap but then with subsequent repetition exhibits increased instability;  Gait in hallway forward walking with ball pass side/side x80 feet x2 laps with close supervision; Patient required min VCS to increase step length and improve gait speed for less instability with passing ball;    Patient did require min-mod A with SLS tasks this session. He was able to exhibit better dynamic balance control with less staggering with gait tasks/dual tasks. Patient denies any soreness or fatigue at end of session. He reports adherence with HEP;                          PT Education - 11/18/20 0932    Education Details balance/HEP, strengthening;    Person(s) Educated Patient    Methods Explanation;Verbal cues    Comprehension Verbalized understanding;Returned demonstration;Verbal cues required;Need further instruction            PT Short Term Goals - 10/21/20 1107      PT SHORT TERM GOAL #1   Title Patient will be adherent to HEP at least 3x a week to improve functional strength and balance for better safety at home.    Baseline 11/15: unable to do this last week from feeling unsteady;    Time 4    Period Weeks    Status Not Met    Target Date 10/08/20      PT SHORT TERM GOAL #2   Title Patient will improve balance as evidenced by being able to hold SLS for >5 sec without loss of balance to exhibit improved stance control and reduce fall risk.    Time 4    Period Weeks    Status New    Target Date 10/08/20             PT Long Term Goals  - 10/21/20 1108      PT LONG TERM GOAL #1   Title Patient will increase Functional Gait Assessment score to >22/30 as to reduce fall risk and improve dynamic gait safety with community ambulation.    Baseline 10/5: 20/30, 11/15: 20/30    Time 4    Period Weeks    Status Not Met    Target Date 11/18/20      PT LONG TERM GOAL #2   Title Patient will improve FOTO score to >60% to indicate improved functional mobility  Baseline 11/15: 45.4%    Time 4    Period Weeks    Status Not Met    Target Date 11/18/20      PT LONG TERM GOAL #3   Title Patient will be independent in walking on unlevel surfaces including ramp/curb negotiation without AD to improve safety in community.    Baseline 11/15: unable, requires walking stick or close supervision;    Time 4    Period Weeks    Status Not Met    Target Date 11/18/20                 Plan - 11/18/20 1001    Clinical Impression Statement Patient motivated and participated well within session. Focused on balance exercise utilizing compliant surface to challenge stance control. Patient does exhibit less unsteadiness with lateral head turns this session compared to previous session. Patient does continue to have trouble with narrow base of support and SLS this session especially while on compliant surfaces. Patient did require CGA to min A for safety. Patient would benefit from additional skilled PT Intervention to improve strength, balance and gait safety;    Personal Factors and Comorbidities Age;Comorbidity 3+;Time since onset of injury/illness/exacerbation    Comorbidities HTN, orthostatic hypotension, memory deficits, sleep apnea, high fall risk, arthritis with chronic low back pain;    Examination-Activity Limitations Carry;Bend;Stairs;Stand    Examination-Participation Restrictions Church;Cleaning;Community Activity;Shop;Volunteer;Yard Work    Stability/Clinical Decision Making Stable/Uncomplicated    Rehab Potential Good    PT  Frequency 2x / week    PT Duration 4 weeks    PT Treatment/Interventions Cryotherapy;Moist Heat;Gait training;Stair training;Functional mobility training;Therapeutic activities;Therapeutic exercise;Balance training;Neuromuscular re-education;Patient/family education;Energy conservation    PT Next Visit Plan address balance deficits    PT Home Exercise Plan no updates this session    Consulted and Agree with Plan of Care Patient           Patient will benefit from skilled therapeutic intervention in order to improve the following deficits and impairments:  Abnormal gait,Decreased balance,Decreased endurance,Decreased mobility,Difficulty walking,Decreased safety awareness,Decreased activity tolerance  Visit Diagnosis: Unsteadiness on feet  History of falling     Problem List Patient Active Problem List   Diagnosis Date Noted  . Lumbar radiculopathy 08/25/2016  . 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

## 2020-11-20 ENCOUNTER — Ambulatory Visit: Payer: Medicare Other | Admitting: Physical Therapy

## 2020-11-26 ENCOUNTER — Other Ambulatory Visit: Payer: Self-pay

## 2020-11-26 ENCOUNTER — Ambulatory Visit: Payer: Medicare Other | Admitting: Physical Therapy

## 2020-11-26 ENCOUNTER — Encounter: Payer: Self-pay | Admitting: Physical Therapy

## 2020-11-26 DIAGNOSIS — Z9181 History of falling: Secondary | ICD-10-CM

## 2020-11-26 DIAGNOSIS — R2681 Unsteadiness on feet: Secondary | ICD-10-CM | POA: Diagnosis not present

## 2020-11-26 NOTE — Therapy (Signed)
[place] MAIN [facility] SERVICES [address] [place], [place], [address] Phone: [phone]   Fax:  [phone]  Physical Therapy Treatment/Discharge Summary  Patient Details  Name: Adam Bradshaw MRN: [id number] Date of Birth: [birth year] Referring Provider (PT): Dr. Manuella Ghazi   Encounter Date: 11/26/2020   PT End of Session - 11/26/20 0938    Visit Number 17    Number of Visits 21    Date for PT Re-Evaluation 11/26/20    Authorization Type medicare    PT Start Time 0932    PT Stop Time 1000    PT Time Calculation (min) 28 min    Equipment Utilized During Treatment Gait belt    Activity Tolerance Patient tolerated treatment well;No increased pain    Behavior During Therapy WFL for tasks assessed/performed           Past Medical History:  Diagnosis Date  . Arthritis   . Headache   . Hypertension   . Pre-diabetes     Past Surgical History:  Procedure Laterality Date  . APPENDECTOMY    . basal skin surgery x2    . CERVICAL DISC SURGERY    . HAND SURGERY Right   . LUMBAR LAMINECTOMY/DECOMPRESSION MICRODISCECTOMY N/A 07/21/2016   Procedure: Lumbar four-five Laminectomy;  Surgeon: Leeroy Cha, MD;  Location: [place] [facility];  Service: Neurosurgery;  Laterality: N/A;  . LUMBAR WOUND DEBRIDEMENT N/A 08/25/2016   Procedure: Exploration of lumbar wound;  Surgeon: Leeroy Cha, MD;  Location: [facility] [facility];  Service: Neurosurgery;  Laterality: N/A;  Exploration of lumbar wound  . SHOULDER SURGERY Right     There were no vitals filed for this visit.   Subjective Assessment - 11/26/20 0937    Subjective "My balance hasn't been worth a darn." Patient denies any falls but reports he has been staggering more. He also reports he has a holter monitor on which he will be wearing full time for the next month.    Pertinent History 74 yo Male reports trouble with his balance over last year or so. He reports approximately 3-4 falls in last 6 months. He  reports most falls occur when he trips over something or mis steps. He denies any numbness/tingling. He is not currently using a RW at this time. He reports using SPC sometimes especially when walking outside of home. He rarely uses assistive device inside home. He reports occasional dizziness/lightheadedness especially when getting up from chair. He denies any falls associated with dizziness. He does have a PMH significant for chronic low back pain. He reports pain is worse in the morning. He does take pain medicine at night and when he gets up. He is unsure if back pain affects his balance. He also has a PMH significant for impaired memory/cognition.    Limitations Standing;Walking    How long can you sit comfortably? NA, will have stiffness when he gets up after sitting for a while    How long can you stand comfortably? 10-15 min, limited by back pain and fatigue    How long can you walk comfortably? about 50 yards    Patient Stated Goals "get my balance back."    Currently in Pain? No/denies    Pain Onset More than a month ago              [facility] PT Assessment - 11/26/20 0001      Observation/Other Assessments   Focus on Therapeutic Outcomes (FOTO)  45%  High Level Balance   High Level Balance Comments SLS: 3 sec each LE with lateral loss of balance      Functional Gait  Assessment   Gait Level Surface Walks 20 ft in less than 5.5 sec, no assistive devices, good speed, no evidence for imbalance, normal gait pattern, deviates no more than 6 in outside of the 12 in walkway width.    Change in Gait Speed Able to smoothly change walking speed without loss of balance or gait deviation. Deviate no more than 6 in outside of the 12 in walkway width.    Gait with Horizontal Head Turns Performs head turns smoothly with no change in gait. Deviates no more than 6 in outside 12 in walkway width    Gait with Vertical Head Turns Performs task with slight change in gait velocity (eg, minor  disruption to smooth gait path), deviates 6 - 10 in outside 12 in walkway width or uses assistive device    Gait and Pivot Turn Pivot turns safely within 3 sec and stops quickly with no loss of balance.    Step Over Obstacle Is able to step over 2 stacked shoe boxes taped together (9 in total height) without changing gait speed. No evidence of imbalance.    Gait with Narrow Base of Support Ambulates 4-7 steps.    Gait with Eyes Closed Walks 20 ft, uses assistive device, slower speed, mild gait deviations, deviates 6-10 in outside 12 in walkway width. Ambulates 20 ft in less than 9 sec but greater than 7 sec.    Ambulating Backwards Walks 20 ft, no assistive devices, good speed, no evidence for imbalance, normal gait    Steps Alternating feet, must use rail.    Total Score 25    FGA comment: lower fall risk, improved from 20/30 on 10/21/20              TREATMENT: Warm up on Crosstrainer, level 2 x4 min (unbilled);  Instructed patient in SLS, functional gait assessment and other ADLs to address goals. See above;  Patient exhibits some improvement in balance however continues to self report staggering and feeling unsteady. He reports no self reported change with ADLs as evidenced with FOTO score. Patient requested to stop therapy at this time. He reports feeling like things may be a little better but he doesn't see a big difference in his balance;                    PT Education - 11/26/20 0937    Education Details balance/HEP; progress towards goals;    Person(s) Educated Patient    Methods Explanation;Verbal cues    Comprehension Verbalized understanding;Returned demonstration;Verbal cues required;Need further instruction            PT Short Term Goals - 11/26/20 0940      PT SHORT TERM GOAL #1   Title Patient will be adherent to HEP at least 3x a week to improve functional strength and balance for better safety at home.    Baseline 11/15: unable to do this last  week from feeling unsteady; 12/21: 5-6 times    Time 4    Period Weeks    Status Achieved    Target Date 10/08/20      PT SHORT TERM GOAL #2   Title Patient will improve balance as evidenced by being able to hold SLS for >5 sec without loss of balance to exhibit improved stance control and reduce fall risk.  Baseline 12/21: 3 sec each LE    Time 4    Period Weeks    Status Not Met    Target Date 10/08/20             PT Long Term Goals - 11/26/20 0941      PT LONG TERM GOAL #1   Title Patient will increase Functional Gait Assessment score to >22/30 as to reduce fall risk and improve dynamic gait safety with community ambulation.    Baseline 10/5: 20/30, 11/15: 20/30, 12/21: 25/30    Time 4    Period Weeks    Status Achieved      PT LONG TERM GOAL #2   Title Patient will improve FOTO score to >60% to indicate improved functional mobility    Baseline 11/15: 45.4%, 12/21: 45%    Time 4    Period Weeks    Status Not Met      PT LONG TERM GOAL #3   Title Patient will be independent in walking on unlevel surfaces including ramp/curb negotiation without AD to improve safety in community.    Baseline 11/15: unable, requires walking stick or close supervision; 12/21: inconsistent, was independent with no staggering a week ago, but reports some unsteadiness at times;    Time 4    Period Weeks    Status Partially Met                 Plan - 11/26/20 1008    Clinical Impression Statement Patient motivated and participated well within session. Patient instructed in outcome measures to address goals. He has made improvements in dynamic balance, but continues to report staggering side/side with some ambulation; Patient does not appear to exhibit staggering while in PT clinic except when doing SLS. Patient self reports having some difficulty when walking across the room and with walking outdoors however when PT has observed patient walking, minimal to no staggering has been  observed. He has been educated in [place] for balance exercise which he reports adherence. However despite adherence he continues to have difficulty with SLS. Pt requested to stop therapy at this time. He has met some goals but not all goals.    Personal Factors and Comorbidities Age;Comorbidity 3+;Time since onset of injury/illness/exacerbation    Comorbidities HTN, orthostatic hypotension, memory deficits, sleep apnea, high fall risk, arthritis with chronic low back pain;    Examination-Activity Limitations Carry;Bend;Stairs;Stand    Examination-Participation Restrictions Church;Cleaning;Community Activity;Shop;Volunteer;Yard Work    Stability/Clinical Decision Making Stable/Uncomplicated    Rehab Potential Good    PT Frequency 2x / week    PT Duration 4 weeks    PT Treatment/Interventions Cryotherapy;Moist Heat;Gait training;Stair training;Functional mobility training;Therapeutic activities;Therapeutic exercise;Balance training;Neuromuscular re-education;Patient/family education;Energy conservation    PT Next Visit Plan address balance deficits    PT Home Exercise Plan no updates this session    Consulted and Agree with Plan of Care Patient           Patient will benefit from skilled therapeutic intervention in order to improve the following deficits and impairments:  Abnormal gait,Decreased balance,Decreased endurance,Decreased mobility,Difficulty walking,Decreased safety awareness,Decreased activity tolerance  Visit Diagnosis: Unsteadiness on feet  History of falling     Problem List Patient Active Problem List   Diagnosis Date Noted  . Lumbar radiculopathy 08/25/2016  . Lumbar stenosis with neurogenic claudication 07/21/2016    Ihor Meinzer PT, DPT 11/26/2020, 10:17 AM  [place] MAIN [facility] SERVICES [place], [place], [address] Phone:  [phone]   Fax:  [phone]  Name: JAKOB KIMBERLIN MRN: [id number] Date of  Birth: Jul 03, 1946

## 2020-12-02 ENCOUNTER — Ambulatory Visit: Payer: Medicare Other | Admitting: Physical Therapy

## 2020-12-04 ENCOUNTER — Ambulatory Visit: Payer: Medicare Other | Admitting: Physical Therapy

## 2020-12-09 ENCOUNTER — Ambulatory Visit: Payer: Medicare Other | Admitting: Physical Therapy

## 2020-12-11 ENCOUNTER — Ambulatory Visit: Payer: Medicare Other | Admitting: Physical Therapy

## 2020-12-16 ENCOUNTER — Ambulatory Visit: Payer: Medicare Other | Admitting: Physical Therapy

## 2020-12-18 ENCOUNTER — Ambulatory Visit: Payer: Medicare Other | Admitting: Physical Therapy

## 2020-12-23 ENCOUNTER — Ambulatory Visit: Payer: Medicare Other | Admitting: Physical Therapy

## 2020-12-25 ENCOUNTER — Ambulatory Visit: Payer: Medicare Other | Admitting: Physical Therapy

## 2020-12-30 ENCOUNTER — Ambulatory Visit: Payer: Medicare Other | Admitting: Physical Therapy

## 2021-01-01 ENCOUNTER — Ambulatory Visit: Payer: Medicare Other | Admitting: Physical Therapy

## 2021-01-06 ENCOUNTER — Ambulatory Visit: Payer: Medicare Other | Admitting: Physical Therapy

## 2021-01-08 ENCOUNTER — Ambulatory Visit: Payer: Medicare Other | Admitting: Physical Therapy

## 2022-01-17 ENCOUNTER — Other Ambulatory Visit: Payer: Self-pay

## 2022-01-17 ENCOUNTER — Encounter (HOSPITAL_COMMUNITY): Payer: Self-pay | Admitting: *Deleted

## 2022-01-17 ENCOUNTER — Emergency Department (HOSPITAL_COMMUNITY): Payer: Medicare Other | Admitting: Certified Registered Nurse Anesthetist

## 2022-01-17 ENCOUNTER — Ambulatory Visit (HOSPITAL_COMMUNITY)
Admission: EM | Admit: 2022-01-17 | Discharge: 2022-01-18 | Disposition: A | Discharge status: Home / Self Care | Payer: Medicare Other | Attending: Emergency Medicine | Admitting: Emergency Medicine

## 2022-01-17 ENCOUNTER — Emergency Department (HOSPITAL_BASED_OUTPATIENT_CLINIC_OR_DEPARTMENT_OTHER): Payer: Medicare Other | Admitting: Certified Registered Nurse Anesthetist

## 2022-01-17 ENCOUNTER — Encounter (HOSPITAL_COMMUNITY)
Admission: EM | Disposition: A | Discharge status: Home / Self Care | Payer: Self-pay | Source: Home / Self Care | Attending: Emergency Medicine

## 2022-01-17 DIAGNOSIS — S6992XA Unspecified injury of left wrist, hand and finger(s), initial encounter: Secondary | ICD-10-CM

## 2022-01-17 DIAGNOSIS — T8742 Infection of amputation stump, left upper extremity: Secondary | ICD-10-CM | POA: Diagnosis not present

## 2022-01-17 DIAGNOSIS — S68625A Partial traumatic transphalangeal amputation of left ring finger, initial encounter: Secondary | ICD-10-CM | POA: Diagnosis not present

## 2022-01-17 DIAGNOSIS — Z87891 Personal history of nicotine dependence: Secondary | ICD-10-CM | POA: Insufficient documentation

## 2022-01-17 DIAGNOSIS — G709 Myoneural disorder, unspecified: Secondary | ICD-10-CM | POA: Diagnosis not present

## 2022-01-17 DIAGNOSIS — S61213A Laceration without foreign body of left middle finger without damage to nail, initial encounter: Secondary | ICD-10-CM

## 2022-01-17 DIAGNOSIS — W312XXA Contact with powered woodworking and forming machines, initial encounter: Secondary | ICD-10-CM | POA: Insufficient documentation

## 2022-01-17 DIAGNOSIS — Z7901 Long term (current) use of anticoagulants: Secondary | ICD-10-CM | POA: Insufficient documentation

## 2022-01-17 DIAGNOSIS — Z20822 Contact with and (suspected) exposure to covid-19: Secondary | ICD-10-CM | POA: Diagnosis not present

## 2022-01-17 DIAGNOSIS — I10 Essential (primary) hypertension: Secondary | ICD-10-CM | POA: Insufficient documentation

## 2022-01-17 DIAGNOSIS — S61212A Laceration without foreign body of right middle finger without damage to nail, initial encounter: Secondary | ICD-10-CM | POA: Diagnosis not present

## 2022-01-17 DIAGNOSIS — Z79899 Other long term (current) drug therapy: Secondary | ICD-10-CM | POA: Insufficient documentation

## 2022-01-17 DIAGNOSIS — I4891 Unspecified atrial fibrillation: Secondary | ICD-10-CM | POA: Diagnosis not present

## 2022-01-17 HISTORY — PX: I & D EXTREMITY: SHX5045

## 2022-01-17 SURGERY — IRRIGATION AND DEBRIDEMENT EXTREMITY
Anesthesia: Monitor Anesthesia Care | Site: Finger | Laterality: Left

## 2022-01-17 MED ORDER — BUPIVACAINE HCL (PF) 0.25 % IJ SOLN
INTRAMUSCULAR | Status: AC
  Filled 2022-01-17: qty 30

## 2022-01-17 MED ORDER — ONDANSETRON HCL 4 MG/2ML IJ SOLN
INTRAMUSCULAR | Status: AC
  Filled 2022-01-17: qty 2

## 2022-01-17 MED ORDER — FENTANYL CITRATE (PF) 250 MCG/5ML IJ SOLN
INTRAMUSCULAR | Status: AC
  Filled 2022-01-17: qty 5

## 2022-01-17 MED ORDER — CEFAZOLIN SODIUM-DEXTROSE 1-4 GM/50ML-% IV SOLN
1.0000 g | Freq: Once | INTRAVENOUS | Status: AC
  Administered 2022-01-17: 1 g via INTRAVENOUS
  Filled 2022-01-17: qty 50

## 2022-01-17 MED ORDER — LIDOCAINE 2% (20 MG/ML) 5 ML SYRINGE
INTRAMUSCULAR | Status: AC
  Filled 2022-01-17: qty 5

## 2022-01-17 MED ORDER — PROPOFOL 10 MG/ML IV BOLUS
INTRAVENOUS | Status: AC
  Filled 2022-01-17: qty 20

## 2022-01-17 MED ORDER — CEPHALEXIN 500 MG PO CAPS: 500.0000 mg | ORAL_CAPSULE | Freq: Four times a day (QID) | ORAL | 0 refills | Status: DC

## 2022-01-17 MED ORDER — BACITRACIN ZINC 500 UNIT/GM EX OINT
TOPICAL_OINTMENT | CUTANEOUS | Status: AC
  Filled 2022-01-17: qty 28.35

## 2022-01-17 SURGICAL SUPPLY — 34 items
BAG COUNTER SPONGE SURGICOUNT (BAG) ×2 IMPLANT
BAG SPNG CNTER NS LX DISP (BAG) ×1
BLADE SURG 15 STRL LF DISP TIS (BLADE) ×2 IMPLANT
BLADE SURG 15 STRL SS (BLADE) ×4
BNDG CMPR 9X4 STRL LF SNTH (GAUZE/BANDAGES/DRESSINGS)
BNDG COHESIVE 1X5 TAN STRL LF (GAUZE/BANDAGES/DRESSINGS) ×1 IMPLANT
BNDG ESMARK 4X9 LF (GAUZE/BANDAGES/DRESSINGS) ×1 IMPLANT
CORD BIPOLAR FORCEPS 12FT (ELECTRODE) ×2 IMPLANT
COVER SURGICAL LIGHT HANDLE (MISCELLANEOUS) ×2 IMPLANT
DRAPE EXTREMITY T 121X128X90 (DISPOSABLE) ×2 IMPLANT
GAUZE SPONGE 4X4 12PLY STRL (GAUZE/BANDAGES/DRESSINGS) ×2 IMPLANT
GAUZE SPONGE 4X4 12PLY STRL LF (GAUZE/BANDAGES/DRESSINGS) ×1 IMPLANT
GAUZE XEROFORM 1X8 LF (GAUZE/BANDAGES/DRESSINGS) ×2 IMPLANT
GLOVE SURG ENC TEXT LTX SZ7 (GLOVE) ×2 IMPLANT
GLOVE SURG UNDER POLY LF SZ7 (GLOVE) ×2 IMPLANT
GOWN STRL REUS W/ TWL XL LVL3 (GOWN DISPOSABLE) ×2 IMPLANT
GOWN STRL REUS W/TWL XL LVL3 (GOWN DISPOSABLE)
KIT BASIN OR (CUSTOM PROCEDURE TRAY) ×2 IMPLANT
KIT TURNOVER KIT B (KITS) ×2 IMPLANT
NDL HYPO 25GX1X1/2 BEV (NEEDLE) IMPLANT
NDL HYPO 25X1 1.5 SAFETY (NEEDLE) ×1 IMPLANT
NEEDLE HYPO 25GX1X1/2 BEV (NEEDLE) IMPLANT
NEEDLE HYPO 25X1 1.5 SAFETY (NEEDLE) ×2 IMPLANT
NS IRRIG 1000ML POUR BTL (IV SOLUTION) ×2 IMPLANT
PACK ORTHO EXTREMITY (CUSTOM PROCEDURE TRAY) ×2 IMPLANT
PAD ARMBOARD 7.5X6 YLW CONV (MISCELLANEOUS) ×2 IMPLANT
SOL PREP POV-IOD 4OZ 10% (MISCELLANEOUS) ×3 IMPLANT
SUT VICRYL RAPIDE 4/0 PS 2 (SUTURE) ×2 IMPLANT
SYR CONTROL 10ML LL (SYRINGE) ×1 IMPLANT
TOWEL GREEN STERILE (TOWEL DISPOSABLE) ×2 IMPLANT
TOWEL GREEN STERILE FF (TOWEL DISPOSABLE) ×4 IMPLANT
TUBE CONNECTING 12X1/4 (SUCTIONS) ×2 IMPLANT
UNDERPAD 30X36 HEAVY ABSORB (UNDERPADS AND DIAPERS) ×2 IMPLANT
YANKAUER SUCT BULB TIP NO VENT (SUCTIONS) ×2 IMPLANT

## 2022-01-17 NOTE — Interval H&P Note (Signed)
History and Physical Interval Note:  01/17/2022 11:51 PM  Adam Bradshaw  has presented today for surgery, with the diagnosis of Amputation Left Ring Finger.  The various methods of treatment have been discussed with the patient and family. After consideration of risks, benefits and other options for treatment, the patient has consented to  Procedure(s): Revision amputation Left Ring finger (Left) as a surgical intervention.  The patient's history has been reviewed, patient examined, no change in status, stable for surgery.  I have reviewed the patient's chart and labs.  Questions were answered to the patient's satisfaction.     [name]

## 2022-01-17 NOTE — ED Provider Notes (Addendum)
[facility] EMERGENCY DEPARTMENT Provider Note   CSN: [id number] Arrival date & time: 01/17/22  1946     History  Chief Complaint  Patient presents with   Finger Injury    Adam Bradshaw is a 76 y.o. male.  The patient presents to the emergency department this evening complaining of a partially severed distal left ring finger and a laceration to the medial side of the distal left long finger. The patient injured himself at approximately 530 this evening while using a miter saw.  He was seen by an urgent care which recommended the patient go to the emergency department at [facility] for evaluation by hand specialist.  The patient is right-hand dominant.  The patient is on Eliquis for atrial fibrillation.  HPI     Home Medications Prior to Admission medications   Medication Sig Start Date End Date Taking? Authorizing Provider  cephALEXin (KEFLEX) 500 MG capsule Take 1 capsule (500 mg total) by mouth 4 (four) times daily. 01/17/22  Yes Dorothyann Peng, PA  aspirin EC 81 MG tablet Take 81 mg by mouth daily.    [provider]  aspirin-acetaminophen-caffeine (EXCEDRIN MIGRAINE) [phone] MG tablet Take 2 tablets by mouth every 6 (six) hours as needed for headache.    [provider]  gabapentin (NEURONTIN) 300 MG capsule Take 300 mg by mouth 3 (three) times daily.    [provider]  HYDROcodone-acetaminophen (NORCO) 10-325 MG tablet Take 1 tablet by mouth every 4 (four) hours as needed for severe pain.  07/02/16   [provider]  HYDROmorphone (DILAUDID) 4 MG tablet Take 4 mg by mouth every 6 (six) hours as needed for moderate pain.  07/28/16   [provider]  indomethacin (INDOCIN) 50 MG capsule Take 2 capsules (100 mg total) by mouth 2 (two) times daily with a meal. 08/30/16   Ashok Pall, MD  losartan (COZAAR) 100 MG tablet Take 100 mg by mouth daily.    [provider]  Multiple Vitamins-Minerals (MULTIVITAMIN WITH  MINERALS) tablet Take 1 tablet by mouth daily.    [provider]  Psyllium (METAMUCIL FREE & NATURAL) 43 % POWD Take 1 packet by mouth 2 (two) times daily.     [provider]  rosuvastatin (CRESTOR) 10 MG tablet Take 10 mg by mouth daily.    [provider]  tamsulosin (FLOMAX) 0.4 MG CAPS capsule Take 0.4 mg by mouth daily.    [provider]      Allergies    No known allergies    Review of Systems   Review of Systems  Respiratory:  Negative for shortness of breath.   Cardiovascular:  Negative for chest pain.  Musculoskeletal:        Partially severed distal left ring finger and laceration of left long finger   Physical Exam Updated Vital Signs BP (!) 167/91 (BP Location: Right Arm)    Pulse 61    Temp 98.7 F (37.1 C) (Oral)    Resp 16    SpO2 98%  Physical Exam Vitals and nursing note reviewed.  Constitutional:      General: He is not in acute distress. HENT:     Head: Normocephalic.  Eyes:     Conjunctiva/sclera: Conjunctivae normal.  Cardiovascular:     Pulses: Normal pulses.  Pulmonary:     Effort: Pulmonary effort is normal.  Musculoskeletal:        General: Signs of injury present.     Left  hand: Deformity and laceration present.     Cervical back: Normal range of motion.     Comments: Distal left ring finger partially severed, laceration to distal medial portion of long finger of left hand  Neurological:     Mental Status: He is alert.                     ED Results / Procedures / Treatments   Labs (all labs ordered are listed, but only abnormal results are displayed) Labs Reviewed  RESP PANEL BY RT-PCR (FLU A&B, COVID) ARPGX2    EKG None  Radiology No results found.  Procedures Procedures    Medications Ordered in ED Medications  ceFAZolin (ANCEF) IVPB 1 g/50 mL premix (has no administration in time range)    ED Course/ Medical Decision Making/ A&P                           Medical  Decision Making Risk Prescription drug management.   This patient presents to the ED for concern of a severed finger tip, this involves an extensive number of treatment options, and is a complaint that carries with it a high risk of complications and morbidity.     Co morbidities that complicate the patient evaluation  None   Additional history obtained:  Additional history obtained from patient's wife External records from outside source obtained and reviewed including imaging from urgent care   Lab Tests:  None   Imaging Studies ordered:   I independently visualized and interpreted imaging from outside urgent care which showed partially severed distal tip of ring finger of the left hand I agree with the radiologist interpretation     Medicines ordered and prescription drug management:  I ordered medication including ancef  for infection prophylaxis  Reevaluation of the patient after these medicines showed that the patient stayed the same I have reviewed the patients home medicines and have made adjustments as needed   Test Considered:  Repeat hand x-ray    Consultations Obtained:  I requested consultation Dr. Tempie Donning, hand specialist,  and discussed lab and imaging findings as well as pertinent plan - He agreed to manage the patient's hand on Monday. After cleaning the hand, he stated that the patient's finger would continue bleeding and decided to take the patient to the OR tonight   Reevaluation:  After the interventions noted above, I reevaluated the patient and found that they have :stayed the same    Dispostion:  After consideration of the diagnostic results and the patients response to treatment, I feel that the patent would benefit from care transfer to hand surgery.  The patient's injury appears to need surgery which is outside the scope of the emergency department. Ancef was administered for infection prophylaxis.   Final Clinical Impression(s)  / ED Diagnoses Final diagnoses:  Injury of finger of left hand, initial encounter    Rx / DC Orders ED Discharge Orders          Ordered    cephALEXin (KEFLEX) 500 MG capsule  4 times daily        01/17/22 2215              Dorothyann Peng, [place] 01/17/22 2217    Dorothyann Peng, [place] 01/17/22 2258    Noemi Chapel, MD 01/18/22 [phone]

## 2022-01-17 NOTE — Discharge Instructions (Addendum)
° ° °Nicolasa Milbrath, M.D. °Hand Surgery ° °POST-OPERATIVE DISCHARGE INSTRUCTIONS ° ° °PRESCRIPTIONS: °You have been given a prescription to be taken as directed for post-operative pain control.  You may also take over the counter ibuprofen/aleve and tylenol for pain. Take this as directed on the packaging. Do not exceed 3000 mg tylenol/acetaminophen in 24 hours. ° °Ibuprofen 600-800 mg (3-4) tablets by mouth every 6 hours as needed for pain.  °OR °Aleve 2 tablets by mouth every 12 hours (twice daily) as needed for pain.  °AND/OR °Tylenol 1000 mg (2 tablets) every 8 hours as needed for pain. ° °Please use your pain medication carefully, as refills are limited and you may not be provided with one.  As stated above, please use over the counter pain medicine - it will also be helpful with decreasing your swelling.  ° ° °ANESTHESIA: °After your surgery, post-surgical discomfort or pain is likely. This discomfort can last several days to a few weeks. At certain times of the day your discomfort may be more intense.  ° °Did you receive a nerve block?  °A nerve block can provide pain relief for one hour to two days after your surgery. As long as the nerve block is working, you will experience little or no sensation in the area the surgeon operated on.  °As the nerve block wears off, you will begin to experience pain or discomfort. It is very important that you begin taking your prescribed pain medication before the nerve block fully wears off. Treating your pain at the first sign of the block wearing off will ensure your pain is better controlled and more tolerable when full-sensation returns. Do not wait until the pain is intolerable, as the medicine will be less effective. It is better to treat pain in advance than to try and catch up.  ° °General Anesthesia:  °If you did not receive a nerve block during your surgery, you will need to start taking your pain medication shortly after your surgery and should continue to do  so as prescribed by your surgeon.   ° ° °ICE AND ELEVATION: °Elevation, as much as possible for the next 48 hours, is critical for decreasing swelling as well as for pain relief. Elevation means when you are seated or lying down, you hand should be at or above your heart. When walking, the hand needs to be at or above the level of your elbow.  °If the bandage gets too tight, it may need to be loosened. Please contact our office and we will instruct you in how to do this.  ° ° °SURGICAL BANDAGES:  °Keep your dressing and/or splint clean and dry at all times.  Do not remove until you are seen again in the office.  If careful, you may place a plastic bag over your bandage and tape the end to shower, but be careful, do not get your bandages wet.  °  ° °HAND THERAPY:  °You may not need any. If you do, we will begin this at your follow up visit in the clinic.  ° ° °ACTIVITY AND WORK: °You are encouraged to move any fingers which are not in the bandage.  °Light use of the fingers is allowed to assist the other hand with daily hygiene and eating, but strong gripping or lifting is often uncomfortable and should be avoided.  °You might miss a variable period of time from work and hopefully this issue has been discussed prior to surgery. You may not do any   heavy work with your affected hand for about 2 weeks.  ° ° °[facility] °[address] °[place],  [place]  [address] °[phone]  °

## 2022-01-17 NOTE — Consult Note (Signed)
HAND SURGERY CONSULTATION  REQUESTING PHYSICIAN: Eber Hong, MD  Chief Complaint: Left ring and middle finger injury  HPI: Adam Bradshaw is a 76 y.o. male who presents with a partial amputation through the ring finger tip and a laceration at the radial aspect of the middle finger.  He was using a table saw earlier today when the saw kicked back and his hand hit the blade.  He was seen at urgent care in [place].  He describes pain that is mostly at the tip of the ring finger.  He denies pain elsewhere in the hand.  He is on Eliquis for an arrhythmia.   Hand dominance: RHD  Occupation: Retired, former Chartered certified accountant  Past Medical History:  Diagnosis Date   Arthritis    Headache    Hypertension    Pre-diabetes    Past Surgical History:  Procedure Laterality Date   APPENDECTOMY     basal skin surgery x2     CERVICAL DISC SURGERY     HAND SURGERY Right    LUMBAR LAMINECTOMY/DECOMPRESSION MICRODISCECTOMY N/A 07/21/2016   Procedure: Lumbar four-five Laminectomy;  Surgeon: Hilda Lias, MD;  Location: [facility];  Service: Neurosurgery;  Laterality: N/A;   LUMBAR WOUND DEBRIDEMENT N/A 08/25/2016   Procedure: Exploration of lumbar wound;  Surgeon: Hilda Lias, MD;  Location: [facility];  Service: Neurosurgery;  Laterality: N/A;  Exploration of lumbar wound   SHOULDER SURGERY Right    Social History   Socioeconomic History   Marital status: Married    Spouse name: Not on file   Number of children: Not on file   Years of education: Not on file   Highest education level: Not on file  Occupational History   Not on file  Tobacco Use   Smoking status: Former    Packs/day: 0.25    Types: Cigarettes    Quit date: 07/17/1975    Years since quitting: 46.5   Smokeless tobacco: Former  Substance and Sexual Activity   Alcohol use: No   Drug use: No   Sexual activity: Not on file  Other Topics Concern   Not on file  Social History Narrative   Not on file   Social  Determinants of Health   Financial Resource Strain: Not on file  Food Insecurity: Not on file  Transportation Needs: Not on file  Physical Activity: Not on file  Stress: Not on file  Social Connections: Not on file   No family history on file. - negative except otherwise stated in the family history section Allergies  Allergen Reactions   No Known Allergies    Prior to Admission medications   Medication Sig Start Date End Date Taking? Authorizing Provider  cephALEXin (KEFLEX) 500 MG capsule Take 1 capsule (500 mg total) by mouth 4 (four) times daily. 01/17/22  Yes Darrick Grinder, PA  aspirin EC 81 MG tablet Take 81 mg by mouth daily.    [provider]  aspirin-acetaminophen-caffeine (EXCEDRIN MIGRAINE) [phone] MG tablet Take 2 tablets by mouth every 6 (six) hours as needed for headache.    [provider]  gabapentin (NEURONTIN) 300 MG capsule Take 300 mg by mouth 3 (three) times daily.    [provider]  HYDROcodone-acetaminophen (NORCO) 10-325 MG tablet Take 1 tablet by mouth every 4 (four) hours as needed for severe pain.  07/02/16   [provider]  HYDROmorphone (DILAUDID) 4 MG tablet Take 4 mg by mouth every 6 (six) hours as needed for  moderate pain.  07/28/16   [provider]  indomethacin (INDOCIN) 50 MG capsule Take 2 capsules (100 mg total) by mouth 2 (two) times daily with a meal. 08/30/16   Coletta Memos, MD  losartan (COZAAR) 100 MG tablet Take 100 mg by mouth daily.    [provider]  Multiple Vitamins-Minerals (MULTIVITAMIN WITH MINERALS) tablet Take 1 tablet by mouth daily.    [provider]  Psyllium (METAMUCIL FREE & NATURAL) 43 % POWD Take 1 packet by mouth 2 (two) times daily.     [provider]  rosuvastatin (CRESTOR) 10 MG tablet Take 10 mg by mouth daily.    [provider]  tamsulosin (FLOMAX) 0.4 MG CAPS capsule Take 0.4 mg by mouth daily.    [provider]   No  results found.  Positive ROS: All other systems have been reviewed and were otherwise negative with the exception of those mentioned in the HPI and as above.  Physical Exam: General: No acute distress, resting comfortably Cardiovascular: BUE warm and well perfused Respiratory: No cyanosis, no use of accessory musculature Skin: No lesions in the area of chief complaint Neurologic: Sensation intact distally Psychiatric: Patient is at baseline mood and affect  Left Hand  Longitudinal laceration at radial border of middle finger distal to DIP Complex injury involving ring finger tip with near complete amputation through the DIP joint and injury to nail bed; tip is dusky appearing Wounds oozing secondary to anti-coagulation Remainder of hand exam unremarkable   Assessment: 76 yo RHD M w/ near complete ring finger amptuation through distal phalanx w/ laceration of radial middle finger tip.   Plan: Had thorough discussion with patient regarding his injury and the treatment options After our discussion, patient would like to proceed with revision amputation of left ring finger and I&D and closure of middle finger laceration   Thank you for the consult and the opportunity to see [name], M.D. [facility] 10:38 PM

## 2022-01-17 NOTE — H&P (View-Only) (Signed)
° °HAND SURGERY CONSULTATION ° °REQUESTING PHYSICIAN: Miller, Brian, MD ° °Chief Complaint: Left ring and middle finger injury ° °HPI: °Adam Bradshaw is a 75 y.o. male who presents with a partial amputation through the ring finger tip and a laceration at the radial aspect of the middle finger.  He was using a table saw earlier today when the saw kicked back and his hand hit the blade.  He was seen at urgent care in [place].  He describes pain that is mostly at the tip of the ring finger.  He denies pain elsewhere in the hand.  He is on Eliquis for an arrhythmia.  ° °Hand dominance: RHD  °Occupation: Retired, former machinist ° °Past Medical History:  °Diagnosis Date  ° Arthritis   ° Headache   ° Hypertension   ° Pre-diabetes   ° °Past Surgical History:  °Procedure Laterality Date  ° APPENDECTOMY    ° basal skin surgery x2    ° CERVICAL DISC SURGERY    ° HAND SURGERY Right   ° LUMBAR LAMINECTOMY/DECOMPRESSION MICRODISCECTOMY N/A 07/21/2016  ° Procedure: Lumbar four-five Laminectomy;  Surgeon: Ernesto Botero, MD;  Location: [facility];  Service: Neurosurgery;  Laterality: N/A;  ° LUMBAR WOUND DEBRIDEMENT N/A 08/25/2016  ° Procedure: Exploration of lumbar wound;  Surgeon: Ernesto Botero, MD;  Location: [facility];  Service: Neurosurgery;  Laterality: N/A;  Exploration of lumbar wound  ° SHOULDER SURGERY Right   ° °Social History  ° °Socioeconomic History  ° Marital status: Married  °  Spouse name: Not on file  ° Number of children: Not on file  ° Years of education: Not on file  ° Highest education level: Not on file  °Occupational History  ° Not on file  °Tobacco Use  ° Smoking status: Former  °  Packs/day: 0.25  °  Types: Cigarettes  °  Quit date: 07/17/1975  °  Years since quitting: 46.5  ° Smokeless tobacco: Former  °Substance and Sexual Activity  ° Alcohol use: No  ° Drug use: No  ° Sexual activity: Not on file  °Other Topics Concern  ° Not on file  °Social History Narrative  ° Not on file  ° °Social  Determinants of Health  ° °Financial Resource Strain: Not on file  °Food Insecurity: Not on file  °Transportation Needs: Not on file  °Physical Activity: Not on file  °Stress: Not on file  °Social Connections: Not on file  ° °No family history on file. °- negative except otherwise stated in the family history section °Allergies  °Allergen Reactions  ° No Known Allergies   ° °Prior to Admission medications   °Medication Sig Start Date End Date Taking? Authorizing Provider  °cephALEXin (KEFLEX) 500 MG capsule Take 1 capsule (500 mg total) by mouth 4 (four) times daily. 01/17/22  Yes McCauley, Larry B, PA  °aspirin EC 81 MG tablet Take 81 mg by mouth daily.    [provider]  °aspirin-acetaminophen-caffeine (EXCEDRIN MIGRAINE) 250-250-65 MG tablet Take 2 tablets by mouth every 6 (six) hours as needed for headache.    [provider]  °gabapentin (NEURONTIN) 300 MG capsule Take 300 mg by mouth 3 (three) times daily.    [provider]  °HYDROcodone-acetaminophen (NORCO) 10-325 MG tablet Take 1 tablet by mouth every 4 (four) hours as needed for severe pain.  07/02/16   [provider]  °HYDROmorphone (DILAUDID) 4 MG tablet Take 4 mg by mouth every 6 (six) hours as needed for   moderate pain.  07/28/16   [provider]  indomethacin (INDOCIN) 50 MG capsule Take 2 capsules (100 mg total) by mouth 2 (two) times daily with a meal. 08/30/16   Coletta Memos, MD  losartan (COZAAR) 100 MG tablet Take 100 mg by mouth daily.    [provider]  Multiple Vitamins-Minerals (MULTIVITAMIN WITH MINERALS) tablet Take 1 tablet by mouth daily.    [provider]  Psyllium (METAMUCIL FREE & NATURAL) 43 % POWD Take 1 packet by mouth 2 (two) times daily.     [provider]  rosuvastatin (CRESTOR) 10 MG tablet Take 10 mg by mouth daily.    [provider]  tamsulosin (FLOMAX) 0.4 MG CAPS capsule Take 0.4 mg by mouth daily.    [provider]   No  results found.  Positive ROS: All other systems have been reviewed and were otherwise negative with the exception of those mentioned in the HPI and as above.  Physical Exam: General: No acute distress, resting comfortably Cardiovascular: BUE warm and well perfused Respiratory: No cyanosis, no use of accessory musculature Skin: No lesions in the area of chief complaint Neurologic: Sensation intact distally Psychiatric: Patient is at baseline mood and affect  Left Hand  Longitudinal laceration at radial border of middle finger distal to DIP Complex injury involving ring finger tip with near complete amputation through the DIP joint and injury to nail bed; tip is dusky appearing Wounds oozing secondary to anti-coagulation Remainder of hand exam unremarkable   Assessment: 76 yo RHD M w/ near complete ring finger amptuation through distal phalanx w/ laceration of radial middle finger tip.   Plan: Had thorough discussion with patient regarding his injury and the treatment options After our discussion, patient would like to proceed with revision amputation of left ring finger and I&D and closure of middle finger laceration   Thank you for the consult and the opportunity to see Mr. Adam Bradshaw, M.D. [facility] 10:38 PM

## 2022-01-17 NOTE — Anesthesia Preprocedure Evaluation (Signed)
Anesthesia Evaluation  Patient identified by MRN, date of birth, ID band Patient awake    Reviewed: Allergy & Precautions, H&P , NPO status , Patient's Chart, lab work & pertinent test results  Airway Mallampati: II   Neck ROM: full    Dental   Pulmonary former smoker,    breath sounds clear to auscultation       Cardiovascular hypertension, + dysrhythmias Atrial Fibrillation  Rhythm:regular Rate:Normal     Neuro/Psych  Headaches,  Neuromuscular disease    GI/Hepatic   Endo/Other    Renal/GU      Musculoskeletal  (+) Arthritis ,   Abdominal   Peds  Hematology   Anesthesia Other Findings   Reproductive/Obstetrics                             Anesthesia Physical Anesthesia Plan  ASA: 3  Anesthesia Plan: MAC   Post-op Pain Management:    Induction: Intravenous  PONV Risk Score and Plan: 1 and Propofol infusion, Ondansetron and Treatment may vary due to age or medical condition  Airway Management Planned: Simple Face Mask  Additional Equipment:   Intra-op Plan:   Post-operative Plan:   Informed Consent: I have reviewed the patients History and Physical, chart, labs and discussed the procedure including the risks, benefits and alternatives for the proposed anesthesia with the patient or authorized representative who has indicated his/her understanding and acceptance.     Dental advisory given  Plan Discussed with: CRNA, Anesthesiologist and Surgeon  Anesthesia Plan Comments:         Anesthesia Quick Evaluation

## 2022-01-17 NOTE — ED Triage Notes (Signed)
Pt arrived from [facility] with xray of hand. Fracture noted to L middle finger. Pt was cutting wood with a saw and cut his finger. Pt is on blood thinners, was given tetanus shot pta

## 2022-01-18 ENCOUNTER — Other Ambulatory Visit: Payer: Self-pay

## 2022-01-18 DIAGNOSIS — S68625A Partial traumatic transphalangeal amputation of left ring finger, initial encounter: Secondary | ICD-10-CM | POA: Diagnosis not present

## 2022-01-18 DIAGNOSIS — S61213A Laceration without foreign body of left middle finger without damage to nail, initial encounter: Secondary | ICD-10-CM

## 2022-01-18 DIAGNOSIS — S61212A Laceration without foreign body of right middle finger without damage to nail, initial encounter: Secondary | ICD-10-CM | POA: Diagnosis not present

## 2022-01-18 DIAGNOSIS — Z7901 Long term (current) use of anticoagulants: Secondary | ICD-10-CM | POA: Diagnosis not present

## 2022-01-18 DIAGNOSIS — Z20822 Contact with and (suspected) exposure to covid-19: Secondary | ICD-10-CM | POA: Diagnosis not present

## 2022-01-18 LAB — RESP PANEL BY RT-PCR (FLU A&B, COVID) ARPGX2
Influenza A by PCR: NEGATIVE
Influenza B by PCR: NEGATIVE
SARS Coronavirus 2 by RT PCR: NEGATIVE

## 2022-01-18 MED ORDER — FENTANYL CITRATE (PF) 100 MCG/2ML IJ SOLN: 25.0000 ug | INTRAMUSCULAR | Status: DC | PRN

## 2022-01-18 MED ORDER — PROPOFOL 500 MG/50ML IV EMUL
INTRAVENOUS | Status: DC | PRN
  Administered 2022-01-18: 100 ug/kg/min via INTRAVENOUS

## 2022-01-18 MED ORDER — OXYCODONE HCL 5 MG/5ML PO SOLN: 5.0000 mg | Freq: Once | ORAL | Status: DC | PRN

## 2022-01-18 MED ORDER — LIDOCAINE HCL (CARDIAC) PF 100 MG/5ML IV SOSY
PREFILLED_SYRINGE | INTRAVENOUS | Status: DC | PRN
  Administered 2022-01-18: 100 mg via INTRAVENOUS

## 2022-01-18 MED ORDER — ONDANSETRON HCL 4 MG/2ML IJ SOLN
INTRAMUSCULAR | Status: AC
  Filled 2022-01-18: qty 2

## 2022-01-18 MED ORDER — BUPIVACAINE HCL (PF) 0.25 % IJ SOLN
INTRAMUSCULAR | Status: DC | PRN
Start: 2022-01-18 — End: 2022-01-18
  Administered 2022-01-18: 10 mL

## 2022-01-18 MED ORDER — LACTATED RINGERS IV SOLN: INTRAVENOUS | Status: DC | PRN

## 2022-01-18 MED ORDER — FENTANYL CITRATE (PF) 100 MCG/2ML IJ SOLN
INTRAMUSCULAR | Status: DC | PRN
  Administered 2022-01-18: 25 ug via INTRAVENOUS

## 2022-01-18 MED ORDER — ONDANSETRON HCL 4 MG/2ML IJ SOLN
INTRAMUSCULAR | Status: DC | PRN
Start: 2022-01-18 — End: 2022-01-18
  Administered 2022-01-18: 4 mg via INTRAVENOUS

## 2022-01-18 MED ORDER — OXYCODONE HCL 5 MG PO TABS: 5.0000 mg | ORAL_TABLET | Freq: Once | ORAL | Status: DC | PRN

## 2022-01-18 MED ORDER — OXYCODONE HCL 5 MG PO TABS: 5.0000 mg | ORAL_TABLET | Freq: Four times a day (QID) | ORAL | 0 refills | Status: DC | PRN

## 2022-01-18 MED ORDER — OXYCODONE HCL 5 MG PO TABS: 5.0000 mg | ORAL_TABLET | Freq: Four times a day (QID) | ORAL | 0 refills | Status: AC | PRN

## 2022-01-18 MED ORDER — ONDANSETRON HCL 4 MG/2ML IJ SOLN: 4.0000 mg | Freq: Four times a day (QID) | INTRAMUSCULAR | Status: DC | PRN

## 2022-01-18 MED ORDER — 0.9 % SODIUM CHLORIDE (POUR BTL) OPTIME
TOPICAL | Status: DC | PRN
  Administered 2022-01-18: 1000 mL

## 2022-01-18 NOTE — Progress Notes (Signed)

## 2022-01-18 NOTE — Brief Op Note (Signed)
01/18/2022  12:59 AM  PATIENT:  Adam Bradshaw  76 y.o. male  PRE-OPERATIVE DIAGNOSIS:  Amputation Left Ring Finger, Left Middle Finger Laceration  POST-OPERATIVE DIAGNOSIS:  Amputation Left Ring Finger, Left Middle Finger Laceration  PROCEDURE:  Procedure(s) with comments: Revision amputation Left Ring finger, Repair Left Middle Finger Laceration (Left) - Ring and Middle fingers  SURGEON:  Surgeon(s) and Role:    * Sherilyn Cooter, MD - Primary  PHYSICIAN ASSISTANT:   ASSISTANTS: none   ANESTHESIA:   local and MAC  EBL:  5 cc   BLOOD ADMINISTERED:none  DRAINS: none   LOCAL MEDICATIONS USED:  BUPIVICAINE   SPECIMEN:  No Specimen  DISPOSITION OF SPECIMEN:  N/A  COUNTS:  YES  TOURNIQUET:  * No tourniquets in log *  DICTATION: .Dragon Dictation  PLAN OF CARE: Discharge to home after PACU  PATIENT DISPOSITION:  PACU - hemodynamically stable.   Delay start of Pharmacological VTE agent (>24hrs) due to surgical blood loss or risk of bleeding: not applicable

## 2022-01-18 NOTE — Op Note (Signed)
° °  Date of Surgery: 01/18/2022  INDICATIONS: Mr. Opiela is a 76 y.o.-year-old male with near amputation of the left ring finger through the distal phalanx and a laceration at the radial aspect of the distal middle finger after using a miter saw earlier today.  The distal aspect of the ring finger was dusky appearing and non viable. Risks, benefits, and alternatives to surgery were again discussed with the patient wishing to proceed with surgery.  Informed consent was signed after our discussion.   PREOPERATIVE DIAGNOSIS:  Traumatic amputation of left ring finger Laceration of distal aspect of middle finger  POSTOPERATIVE DIAGNOSIS: Same.  PROCEDURE:  Revision amputation of left ring finger Closure of middle finger wound with local tissue rearrangement   SURGEON: Audria Nine, M.D.  ASSIST:   ANESTHESIA:  Local, MAC  IV FLUIDS AND URINE: See anesthesia.  ESTIMATED BLOOD LOSS: 5 mL.  IMPLANTS: * No implants in log *   DRAINS: None  COMPLICATIONS: see description of procedure.  DESCRIPTION OF PROCEDURE: The patient was met in the preoperative holding area where the surgical site was marked and the consent form was verified.  The patient was then taken to the operating room and transferred to the operating table.  All bony prominences were well padded.  The operative extremity was prepped and draped in the usual and sterile fashion.  A formal time-out was performed to confirm that this was the correct patient, surgery, side, and site.   A digital block was performed using 10cc of 0.25% bupivicaine.  A turnicot was applied to the left ring finger.  The nonviable tip was sharply excised.  A fishmouth type incision was designed centered over the DIP joint.  The skin and subcutaneous tissue was divided.  The extensor apparatus was divided.  The flexor tendons were identified and sharply divided.  The proximal aspect of the distal phalanx was disarticulated.  A portion of the middle  phalangeal head was removed using a rongeur to allow for a tension-free closure.  The wound was thoroughly irrigated.  The radial and ulnar neurovascular bundles were identified and a traction neurectomy performed on both sides.  Bipolar cautery was used to coagulate the digital vessels.  The tourniquet was removed.  Hemostasis was achieved using bipolar cautery and direct pressure.  The wound was closed using 4-0 Vicryl Rapide sutures.  I then turned my attention to the laceration at the radial aspect of the middle finger.  The laceration extended from just proximal to the fingertip to just proximal to the DIP joint.  There was an [place] of skin with minimal attached subcutaneous fat that would likely not survive.  This island was sharply excised.  The wound was closed using 4-0 Vicryl repeat sutures.  Both wounds were then thoroughly cleaned.  They are dressed with Xeroform, bacitracin limit, 4 x 4's, and loosely wrapped Coban.  The patient was then reversed from sedation and transferred the postoperative bed.  All counts were correct at the end the procedure.  The patient was and taken the PACU in stable condition.  POSTOPERATIVE PLAN: Patient will be discharged to home with appropriate pain medication and discharge instructions.  I'll see him back next week for a wound check.  Audria Nine, MD 1:00 AM

## 2022-01-18 NOTE — Transfer of Care (Signed)
Immediate Anesthesia Transfer of Care Note  Patient: Adam Bradshaw  Procedure(s) Performed: Revision amputation Left Ring finger, Repair Left Middle Finger Laceration (Left: Finger)  Patient Location: PACU  Anesthesia Type:MAC  Level of Consciousness: awake, alert  and oriented  Airway & Oxygen Therapy: Patient Spontanous Breathing and Patient connected to nasal cannula oxygen  Post-op Assessment: Report given to RN, Post -op Vital signs reviewed and stable and Patient moving all extremities  Post vital signs: Reviewed and stable  Last Vitals:  Vitals Value Taken Time  BP 124/73 01/18/22 0105  Temp    Pulse 64 01/18/22 0108  Resp 19 01/18/22 0108  SpO2 98 % 01/18/22 0108  Vitals shown include unvalidated device data.  Last Pain:  Vitals:   01/17/22 1952  TempSrc: Oral  PainSc:          Complications: No notable events documented.

## 2022-01-19 ENCOUNTER — Encounter (HOSPITAL_COMMUNITY): Payer: Self-pay | Admitting: Orthopedic Surgery

## 2022-01-20 NOTE — Anesthesia Postprocedure Evaluation (Signed)
Anesthesia Post Note  Patient: Adam Bradshaw  Procedure(s) Performed: Revision amputation Left Ring finger, Repair Left Middle Finger Laceration (Left: Finger)     Patient location during evaluation: PACU Anesthesia Type: MAC Level of consciousness: awake and alert Pain management: pain level controlled Vital Signs Assessment: post-procedure vital signs reviewed and stable Respiratory status: spontaneous breathing, nonlabored ventilation, respiratory function stable and patient connected to nasal cannula oxygen Cardiovascular status: stable and blood pressure returned to baseline Postop Assessment: no apparent nausea or vomiting Anesthetic complications: no   No notable events documented.  Last Vitals:  Vitals:   01/18/22 0140 01/18/22 0145  BP: (!) 148/91   Pulse: (!) 52 (!) 52  Resp: 12 14  Temp: 36.7 C   SpO2: 99% 96%    Last Pain:  Vitals:   01/18/22 0130  TempSrc:   PainSc: 0-No pain                 Kemia Wendel S

## 2022-01-23 ENCOUNTER — Other Ambulatory Visit: Payer: Self-pay

## 2022-01-23 ENCOUNTER — Encounter: Payer: Self-pay | Admitting: Orthopedic Surgery

## 2022-01-23 ENCOUNTER — Ambulatory Visit (INDEPENDENT_AMBULATORY_CARE_PROVIDER_SITE_OTHER): Payer: Medicare Other | Admitting: Orthopedic Surgery

## 2022-01-23 VITALS — Ht 69.0 in | Wt 208.0 lb

## 2022-01-23 DIAGNOSIS — S68625A Partial traumatic transphalangeal amputation of left ring finger, initial encounter: Secondary | ICD-10-CM

## 2022-01-23 DIAGNOSIS — S61213A Laceration without foreign body of left middle finger without damage to nail, initial encounter: Secondary | ICD-10-CM

## 2022-01-23 NOTE — Progress Notes (Signed)
° °  Post-Op Visit Note   Patient: Adam Bradshaw           Date of Birth: 10-31-1946           MRN: [id number] Visit Date: 01/23/2022 PCP: Coralee Rud, PA-C   Assessment & Plan:  Chief Complaint:  Chief Complaint  Patient presents with   Left Hand - Routine Post Op    01/17/2022 revision amputation left ring finger, repair left middle finger laceration   Visit Diagnoses:  1. 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 Laceration of left middle finger without foreign body without damage to nail, initial encounter     Plan: Patient is doing well postoperatively.  Pain well controlled.  Wounds are clean and dry.  Will keep covered and start wound care next week with warm, soapy water and daily dressing changes.  I'll see him back next Thursday.   Follow-Up Instructions: No follow-ups on file.   Orders:  No orders of the defined types were placed in this encounter.  No orders of the defined types were placed in this encounter.   Imaging: No results found.  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  Past Surgical History:  Procedure Laterality Date   APPENDECTOMY     basal skin surgery x2     CERVICAL DISC SURGERY     HAND SURGERY Right    I & D EXTREMITY Left 01/17/2022   Procedure: Revision amputation Left Ring finger, Repair Left Middle Finger Laceration;  Surgeon: Marlyne Beards, MD;  Location: [facility];  Service: Orthopedics;  Laterality: Left;  Ring and Middle fingers   LUMBAR LAMINECTOMY/DECOMPRESSION MICRODISCECTOMY N/A  07/21/2016   Procedure: Lumbar four-five Laminectomy;  Surgeon: Hilda Lias, MD;  Location: [facility];  Service: Neurosurgery;  Laterality: N/A;   LUMBAR WOUND DEBRIDEMENT N/A 08/25/2016   Procedure: Exploration of lumbar wound;  Surgeon: Hilda Lias, MD;  Location: [facility];  Service: Neurosurgery;  Laterality: N/A;  Exploration of lumbar wound   SHOULDER SURGERY Right    Social History   Occupational History   Not on file  Tobacco Use   Smoking status: Former    Packs/day: 0.25    Types: Cigarettes    Quit date: 07/17/1975    Years since quitting: 46.5   Smokeless tobacco: Former  Substance and Sexual Activity   Alcohol use: No   Drug use: No   Sexual activity: Not on file

## 2022-01-29 ENCOUNTER — Other Ambulatory Visit: Payer: Self-pay

## 2022-01-29 ENCOUNTER — Ambulatory Visit (INDEPENDENT_AMBULATORY_CARE_PROVIDER_SITE_OTHER): Payer: Medicare Other | Admitting: Orthopedic Surgery

## 2022-01-29 DIAGNOSIS — S61213A Laceration without foreign body of left middle finger without damage to nail, initial encounter: Secondary | ICD-10-CM

## 2022-01-29 DIAGNOSIS — S68625A Partial traumatic transphalangeal amputation of left ring finger, initial encounter: Secondary | ICD-10-CM

## 2022-01-29 NOTE — Progress Notes (Signed)
° °  Post-Op Visit Note   Patient: Adam Bradshaw           Date of Birth: 13-Feb-1946           MRN: [id number] Visit Date: 01/29/2022 PCP: Coralee Rud, PA-C   Assessment & Plan:  Chief Complaint:  Chief Complaint  Patient presents with   Other    01/17/22 Left ring finger revision of amputation   Visit Diagnoses:  1. 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 Laceration of left middle finger without foreign body without damage to nail, initial encounter     Plan: Wounds are clean, dry, and healing well.  No drainage or erythema.  Mild sensitivity in the tip of the ring finger.  Vicryl rapide sutures in place.  Can continue to keep wounds clean with warm soapy water.  I can see back in another two weeks.   Follow-Up Instructions: No follow-ups on file.   Orders:  No orders of the defined types were placed in this encounter.  No orders of the defined types were placed in this encounter.   Imaging: No results found.  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  Past Surgical History:  Procedure Laterality Date   APPENDECTOMY     basal skin surgery x2     CERVICAL DISC SURGERY     HAND SURGERY Right    I & D EXTREMITY Left 01/17/2022   Procedure: Revision amputation Left Ring finger, Repair Left Middle Finger Laceration;  Surgeon: Marlyne Beards, MD;  Location: [facility];  Service: Orthopedics;  Laterality: Left;  Ring and Middle fingers   LUMBAR LAMINECTOMY/DECOMPRESSION MICRODISCECTOMY N/A 07/21/2016   Procedure: Lumbar four-five  Laminectomy;  Surgeon: Hilda Lias, MD;  Location: [facility];  Service: Neurosurgery;  Laterality: N/A;   LUMBAR WOUND DEBRIDEMENT N/A 08/25/2016   Procedure: Exploration of lumbar wound;  Surgeon: Hilda Lias, MD;  Location: [facility];  Service: Neurosurgery;  Laterality: N/A;  Exploration of lumbar wound   SHOULDER SURGERY Right    Social History   Occupational History   Not on file  Tobacco Use   Smoking status: Former    Packs/day: 0.25    Types: Cigarettes    Quit date: 07/17/1975    Years since quitting: 46.5   Smokeless tobacco: Former  Substance and Sexual Activity   Alcohol use: No   Drug use: No   Sexual activity: Not on file

## 2022-02-10 ENCOUNTER — Other Ambulatory Visit: Payer: Self-pay

## 2022-02-10 ENCOUNTER — Ambulatory Visit (INDEPENDENT_AMBULATORY_CARE_PROVIDER_SITE_OTHER): Payer: Medicare Other | Admitting: Orthopedic Surgery

## 2022-02-10 DIAGNOSIS — S68625A Partial traumatic transphalangeal amputation of left ring finger, initial encounter: Secondary | ICD-10-CM

## 2022-02-10 DIAGNOSIS — S61213A Laceration without foreign body of left middle finger without damage to nail, initial encounter: Secondary | ICD-10-CM

## 2022-02-10 NOTE — Progress Notes (Signed)
? ?Office Visit Note ?  ?Patient: Adam Bradshaw           ?Date of Birth: [birth year]           ?MRN: [id number] ?Visit Date: 02/10/2022 ?             ?Requested by: Coralee Rud, PA-C ?[facility]  ?[address] ?[place],  [place] [id number] ?PCP: Coralee Rud, PA-C ? ? ?Assessment & Plan: ?Visit Diagnoses:  ?1. 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 Laceration of left middle finger without foreign body without damage to nail, initial encounter   ? ? ?Plan: Patient doing very well postoperatively.  Left ring finger amputation well healed without surrounding erythema or induration.  Improved swelling.  Middle finger wound almost completely healed.  Able to do all of his usual activities around the house.  He can follow up again in another month if they have any concerns but can otherwise follow up as needed.  ? ?Follow-Up Instructions: No follow-ups on file.  ? ?Orders:  ?No orders of the defined types were placed in this encounter. ? ?No orders of the defined types were placed in this encounter. ? ? ? ? Procedures: ?No procedures performed ? ? ?Clinical Data: ?No additional findings. ? ? ?Subjective: ?Chief Complaint  ?Patient presents with  ? Left Ring Finger - Routine Post Op  ?  Doing great no problems  ? ? ?HPI ? ?Review of Systems ? ? ?Objective: ?Vital Signs: There were no vitals taken for this visit. ? ?Physical Exam ? ?Ortho Exam ? ?Specialty Comments:  ?No specialty comments available. ? ?Imaging: ?No results found. ? ? ?PMFS History: ?Patient Active Problem List  ? Diagnosis Date Noted  ? 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 Laceration of left middle finger without foreign body without damage to nail   ? Lumbar radiculopathy 08/25/2016  ? 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 ?Past Medical History:  ?Diagnosis Date  ? Arthritis   ? Headache   ? Hypertension   ? Pre-diabetes   ?  ?No  family history on file.  ?Past Surgical History:  ?Procedure Laterality Date  ? APPENDECTOMY    ? basal skin surgery x2    ? CERVICAL DISC SURGERY    ? HAND SURGERY Right   ? I & D EXTREMITY Left 01/17/2022  ? Procedure: Revision amputation Left Ring finger, Repair Left Middle Finger Laceration;  Surgeon: Marlyne Beards, MD;  Location: [facility];  Service: Orthopedics;  Laterality: Left;  Ring and Middle fingers  ? LUMBAR LAMINECTOMY/DECOMPRESSION MICRODISCECTOMY N/A 07/21/2016  ? Procedure: Lumbar four-five Laminectomy;  Surgeon: Hilda Lias, MD;  Location: [facility];  Service: Neurosurgery;  Laterality: N/A;  ? LUMBAR WOUND DEBRIDEMENT N/A 08/25/2016  ? Procedure: Exploration of lumbar wound;  Surgeon: Hilda Lias, MD;  Location: [facility];  Service: Neurosurgery;  Laterality: N/A;  Exploration of lumbar wound  ? SHOULDER SURGERY Right   ? ?Social History  ? ?Occupational History  ? Not on file  ?Tobacco Use  ? Smoking status: Former  ?  Packs/day: 0.25  ?  Types: Cigarettes  ?  Quit date: 07/17/1975  ?  Years since quitting: 46.6  ? Smokeless tobacco: Former  ?Substance and Sexual Activity  ? Alcohol use: No  ? Drug use: No  ? Sexual activity: Not on file  ? ? ? ? ? ? ?

## 2022-03-10 ENCOUNTER — Ambulatory Visit (INDEPENDENT_AMBULATORY_CARE_PROVIDER_SITE_OTHER): Payer: Medicare Other | Admitting: Orthopedic Surgery

## 2022-03-10 DIAGNOSIS — S68625A Partial traumatic transphalangeal amputation of left ring finger, initial encounter: Secondary | ICD-10-CM

## 2022-03-10 DIAGNOSIS — S61213A Laceration without foreign body of left middle finger without damage to nail, initial encounter: Secondary | ICD-10-CM

## 2022-03-10 NOTE — Progress Notes (Signed)
? ?  Post-Op Visit Note ?  ?Patient: Adam Bradshaw           ?Date of Birth: 17-Nov-1946           ?MRN: [id number] ?Visit Date: 03/10/2022 ?PCP: Coralee Rud, PA-C ? ? ?Assessment & Plan: ? ?Chief Complaint:  ?Chief Complaint  ?Patient presents with  ? Left Ring Finger - Follow-up, Routine Post Op  ? Left Middle Finger - Follow-up  ? ?Visit Diagnoses:  ?1. Laceration of left middle finger without foreign body without damage to nail, initial encounter   ?2. 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 ? ?Plan: Patient doing well postoperatively.  Amputation and laceration have healed great.  Mild swelling of ring finger at the amputation stump.  Able to make a full fist and doing all of his usual activities without issue.  He can see me again as needed.  ? ?Follow-Up Instructions: No follow-ups on file.  ? ?Orders:  ?No orders of the defined types were placed in this encounter. ? ?No orders of the defined types were placed in this encounter. ? ? ?Imaging: ?No results found. ? ?PMFS History: ?Patient Active Problem List  ? Diagnosis Date Noted  ? 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 Laceration of left middle finger without foreign body without damage to nail   ? Lumbar radiculopathy 08/25/2016  ? 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 ?Past Medical History:  ?Diagnosis Date  ? Arthritis   ? Headache   ? Hypertension   ? Pre-diabetes   ?  ?No family history on file.  ?Past Surgical History:  ?Procedure Laterality Date  ? APPENDECTOMY    ? basal skin surgery x2    ? CERVICAL DISC SURGERY    ? HAND SURGERY Right   ? I & D EXTREMITY Left 01/17/2022  ? Procedure: Revision amputation Left Ring finger, Repair Left Middle Finger Laceration;  Surgeon: Marlyne Beards, MD;  Location: [facility];  Service: Orthopedics;  Laterality: Left;  Ring and Middle fingers  ? LUMBAR LAMINECTOMY/DECOMPRESSION MICRODISCECTOMY N/A 07/21/2016  ?  Procedure: Lumbar four-five Laminectomy;  Surgeon: Hilda Lias, MD;  Location: [facility];  Service: Neurosurgery;  Laterality: N/A;  ? LUMBAR WOUND DEBRIDEMENT N/A 08/25/2016  ? Procedure: Exploration of lumbar wound;  Surgeon: Hilda Lias, MD;  Location: [facility];  Service: Neurosurgery;  Laterality: N/A;  Exploration of lumbar wound  ? SHOULDER SURGERY Right   ? ?Social History  ? ?Occupational History  ? Not on file  ?Tobacco Use  ? Smoking status: Former  ?  Packs/day: 0.25  ?  Types: Cigarettes  ?  Quit date: 07/17/1975  ?  Years since quitting: 46.6  ? Smokeless tobacco: Former  ?Substance and Sexual Activity  ? Alcohol use: No  ? Drug use: No  ? Sexual activity: Not on file  ? ? ? ?

## 2024-05-02 ENCOUNTER — Emergency Department
Admission: EM | Admit: 2024-05-02 | Discharge: 2024-05-02 | Disposition: A | Discharge status: Home / Self Care | Attending: Emergency Medicine | Admitting: Emergency Medicine

## 2024-05-02 ENCOUNTER — Emergency Department

## 2024-05-02 ENCOUNTER — Other Ambulatory Visit: Payer: Self-pay

## 2024-05-02 DIAGNOSIS — S61213A Laceration without foreign body of left middle finger without damage to nail, initial encounter: Secondary | ICD-10-CM | POA: Diagnosis not present

## 2024-05-02 DIAGNOSIS — W312XXA Contact with powered woodworking and forming machines, initial encounter: Secondary | ICD-10-CM | POA: Diagnosis not present

## 2024-05-02 DIAGNOSIS — S6990XA Unspecified injury of unspecified wrist, hand and finger(s), initial encounter: Secondary | ICD-10-CM

## 2024-05-02 DIAGNOSIS — S6992XA Unspecified injury of left wrist, hand and finger(s), initial encounter: Secondary | ICD-10-CM | POA: Diagnosis present

## 2024-05-02 MED ORDER — LIDOCAINE-EPINEPHRINE-TETRACAINE (LET) TOPICAL GEL
3.0000 mL | Freq: Once | TOPICAL | Status: AC
  Administered 2024-05-02: 3 mL via TOPICAL
  Filled 2024-05-02: qty 3

## 2024-05-02 MED ORDER — CEPHALEXIN 500 MG PO CAPS: 500.0000 mg | ORAL_CAPSULE | Freq: Four times a day (QID) | ORAL | 0 refills | Status: AC

## 2024-05-02 MED ORDER — LIDOCAINE HCL (PF) 1 % IJ SOLN
5.0000 mL | Freq: Once | INTRAMUSCULAR | Status: AC
  Administered 2024-05-02: 5 mL
  Filled 2024-05-02: qty 5

## 2024-05-02 NOTE — ED Triage Notes (Signed)
 Pt states he was using a table saw to cut a knot, the saw grabbed his left middle finger, laceration wrapped in triage, slow bleeding noted, denies blood thinners

## 2024-05-02 NOTE — ED Provider Notes (Signed)
 [facility] Emergency Department Provider Note     Event Date/Time   First MD Initiated Contact with Patient 05/02/24 1833     (approximate)   History   Laceration   HPI  Adam Bradshaw is a 78 y.o. male presents to the ED for evaluation of left middle finger laceration.  Patient reports he was using a table saw and it slipped cutting his finger.  He reports same happened to his left ring finger in which he had the finger amputated 3 years ago (01/17/2022).  He denies blood thinners at this time. Sensation is intact.  Tetanus is up-to-date     Physical Exam   Triage Vital Signs: ED Triage Vitals  Encounter Vitals Group     BP 05/02/24 1802 (!) 143/99     Systolic BP Percentile --      Diastolic BP Percentile --      Pulse Rate 05/02/24 1802 89     Resp 05/02/24 1802 16     Temp 05/02/24 1802 98.3 F (36.8 C)     Temp Source 05/02/24 1802 Oral     SpO2 05/02/24 1802 97 %     Weight 05/02/24 1803 162 lb (73.5 kg)     Height 05/02/24 1803 5\' 9"  (1.753 m)     Head Circumference --      Peak Flow --      Pain Score 05/02/24 1802 7     Pain Loc --      Pain Education --      Exclude from Growth Chart --     Most recent vital signs: Vitals:   05/02/24 1802  BP: (!) 143/99  Pulse: 89  Resp: 16  Temp: 98.3 F (36.8 C)  SpO2: 97%    General Awake, no distress.  HEENT NCAT.  CV:  Good peripheral perfusion.  RESP:  Normal effort.  ABD:  No distention.  Other:  Please see media below.  Neurovascular status intact all throughout.  Normal flexor and extensor strength.  Radial pulse palpated equal bilateral.  Normal capillary refill.    ED Results / Procedures / Treatments   Labs (all labs ordered are listed, but only abnormal results are displayed) Labs Reviewed - No data to display  RADIOLOGY  I personally viewed and evaluated these images as part of my medical decision making, as well as reviewing the written report by the  radiologist.  ED Provider Interpretation: No obvious bony abnormality noted.  Will confirm with final radiology read.  DG Finger Middle Left Result Date: 05/02/2024 CLINICAL DATA:  Laceration EXAM: LEFT MIDDLE FINGER 3V COMPARISON:  None Available. 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 No acute fracture, dislocation or subluxation. No osteolytic or osteoblastic changes. No radiopaque foreign bodies. IMPRESSION: Degenerative changes. No acute osseous abnormalities. Electronically Signed   By: Sydell Eva M.D.   On: 05/02/2024 19:31    PROCEDURES:  Critical Care performed: No  .Laceration Repair  Date/Time: 05/02/2024 9:10 PM  Performed by: Billye Buerger, PA-C Authorized by: Billye Buerger, PA-C   Consent:    Consent obtained:  Verbal   Consent given by:  Patient   Risks discussed:  Pain, infection, need for additional repair, poor wound healing, poor cosmetic result and nerve damage Anesthesia:    Anesthesia method:  Topical application and local infiltration   Topical anesthetic:  LET   Local anesthetic:  Lidocaine  1% w/o epi  Laceration details:    Location:  Finger   Finger location:  L long finger   Length (cm):  2   Depth (mm):  0.5 Exploration:    Hemostasis achieved with:  Direct pressure, LET and tourniquet   Imaging obtained: x-ray     Imaging outcome: foreign body not noted   Treatment:    Area cleansed with:  Povidone-iodine, chlorhexidine  and saline   Amount of cleaning:  Extensive   Irrigation solution:  Sterile saline   Irrigation method:  Pressure wash Skin repair:    Repair method: Unable to close with sutures. Post-procedure details:    Dressing:  Antibiotic ointment and non-adherent dressing   Procedure completion:  Procedure terminated electively by provider Comments:     Second intention healing    MEDICATIONS ORDERED IN ED: Medications  lidocaine  (PF) (XYLOCAINE ) 1  % injection 5 mL (has no administration in time range)  lidocaine -EPINEPHrine-tetracaine (LET) topical gel (3 mLs Topical Given by Other 05/02/24 1907)     IMPRESSION / MDM / ASSESSMENT AND PLAN / ED COURSE  I reviewed the triage vital signs and the nursing notes.                              Clinical Course as of 05/02/24 2053  Tue May 02, 2024  2044 DG Finger Middle Left IMPRESSION: Degenerative changes. No acute osseous abnormalities.   [MH]    Clinical Course User Index [MH] Billye Buerger, PA-C    78 y.o. male presents to the emergency department for evaluation and treatment of finger injury to left middle. See HPI for further details.   Differential diagnosis includes, but is not limited to laceration, fracture, abrasion, tendon injury  Patient's presentation is most consistent with acute complicated illness / injury requiring diagnostic workup.  Patient is alert and oriented.  He is hemodynamically stable and well-appearing.  Please see media for visual of finger injury.  Sutures were attempted to close the wound however given fragility of skin and irregular borders of injury, sutures were unable to be placed.  Xeroform gauze and a compression dressing was placed over the finger for secondary granulation healing.  Thorough discussion of wound care and frequent dressing changes provided to patient.  He is encouraged to follow-up with his PCP in 1 week for a wound check.  Patient verbalized understanding.  Will place him on antibiotics.  Wound dressing material provided at discharge.  Patient stable condition for discharge home at this time.  ED return precautions discussed.  FINAL CLINICAL IMPRESSION(S) / ED DIAGNOSES   Final diagnoses:  Finger injury, initial encounter  Laceration of left middle finger without foreign body, nail damage status unspecified, initial encounter     Rx / DC Orders   ED Discharge Orders          Ordered    cephALEXin  (KEFLEX ) 500 MG  capsule  4 times daily        05/02/24 2036             Note:  This document was prepared using Dragon voice recognition software and may include unintentional dictation errors.    Phyllis Breeze, Hershall Benkert A, PA-C 05/02/24 2113    Arline Bennett, MD 05/02/24 2137

## 2024-05-02 NOTE — Discharge Instructions (Signed)
 Your evaluated in the ED for a finger injury.  Your x-ray is normal.  Due to the extent of your wound sutures were not able to be placed and close the affected area.  Your wound will heal through second intention healing which means this wound will heal from inside out.  You will need to leave our dressing in place for 48 hours.  Once a day you will need to do a wound check making sure the area is clean at all times.  Be sure to clean around the wound with mild soap and water.  You can apply a small amount of antibiotic ointment to the area and then cover the wound with gauze.  Monitor the wound for signs of infection.  Follow-up with your primary care provider in 1 week for wound recheck.
